# Patient Record
Sex: Female | Born: 1940 | Race: White | Hispanic: No | Marital: Single | Smoking: Current every day smoker
Health system: Southern US, Community
[De-identification: ages and names within clinical notes are randomized; demographics above are authoritative.]

## PROBLEM LIST (undated history)

## (undated) DIAGNOSIS — J449 Chronic obstructive pulmonary disease, unspecified: Secondary | ICD-10-CM

## (undated) DIAGNOSIS — J45909 Unspecified asthma, uncomplicated: Secondary | ICD-10-CM

## (undated) DIAGNOSIS — C801 Malignant (primary) neoplasm, unspecified: Secondary | ICD-10-CM

## (undated) DIAGNOSIS — I10 Essential (primary) hypertension: Secondary | ICD-10-CM

## (undated) HISTORY — PX: CORONARY ARTERY BYPASS GRAFT: SHX141

## (undated) HISTORY — PX: LUNG REMOVAL, PARTIAL: SHX233

---

## 2006-11-29 ENCOUNTER — Ambulatory Visit (HOSPITAL_COMMUNITY): Admission: RE | Admit: 2006-11-29 | Discharge: 2006-11-29 | Payer: Self-pay | Admitting: Specialist

## 2006-12-09 ENCOUNTER — Ambulatory Visit: Payer: Self-pay | Admitting: Thoracic Surgery

## 2006-12-17 ENCOUNTER — Encounter: Payer: Self-pay | Admitting: Thoracic Surgery

## 2006-12-17 ENCOUNTER — Ambulatory Visit: Payer: Self-pay | Admitting: Thoracic Surgery

## 2006-12-17 ENCOUNTER — Ambulatory Visit: Payer: Self-pay | Admitting: Pulmonary Disease

## 2006-12-17 ENCOUNTER — Inpatient Hospital Stay (HOSPITAL_COMMUNITY): Admission: RE | Admit: 2006-12-17 | Discharge: 2006-12-24 | Payer: Self-pay | Admitting: Thoracic Surgery

## 2006-12-20 ENCOUNTER — Encounter: Payer: Self-pay | Admitting: Thoracic Surgery

## 2006-12-30 ENCOUNTER — Ambulatory Visit: Payer: Self-pay | Admitting: Thoracic Surgery

## 2006-12-30 ENCOUNTER — Encounter: Admission: RE | Admit: 2006-12-30 | Discharge: 2006-12-30 | Payer: Self-pay | Admitting: Thoracic Surgery

## 2007-01-27 ENCOUNTER — Encounter: Admission: RE | Admit: 2007-01-27 | Discharge: 2007-01-27 | Payer: Self-pay | Admitting: Thoracic Surgery

## 2007-01-27 ENCOUNTER — Ambulatory Visit: Payer: Self-pay | Admitting: Thoracic Surgery

## 2007-03-10 ENCOUNTER — Encounter: Admission: RE | Admit: 2007-03-10 | Discharge: 2007-03-10 | Payer: Self-pay | Admitting: Thoracic Surgery

## 2007-03-10 ENCOUNTER — Ambulatory Visit: Payer: Self-pay | Admitting: Thoracic Surgery

## 2007-05-10 ENCOUNTER — Ambulatory Visit: Payer: Self-pay | Admitting: Thoracic Surgery

## 2007-05-10 ENCOUNTER — Encounter: Admission: RE | Admit: 2007-05-10 | Discharge: 2007-05-10 | Payer: Self-pay | Admitting: Thoracic Surgery

## 2008-03-06 IMAGING — CR DG CHEST 2V
2 series · 2 of 2 positions shown · non-contrast
Comparison: Chest two views 12/24/06.

CLINICAL DATA: Lung lesion and postop. 
CHEST ? 2 VIEW:

[view not recorded (1 of 2)]
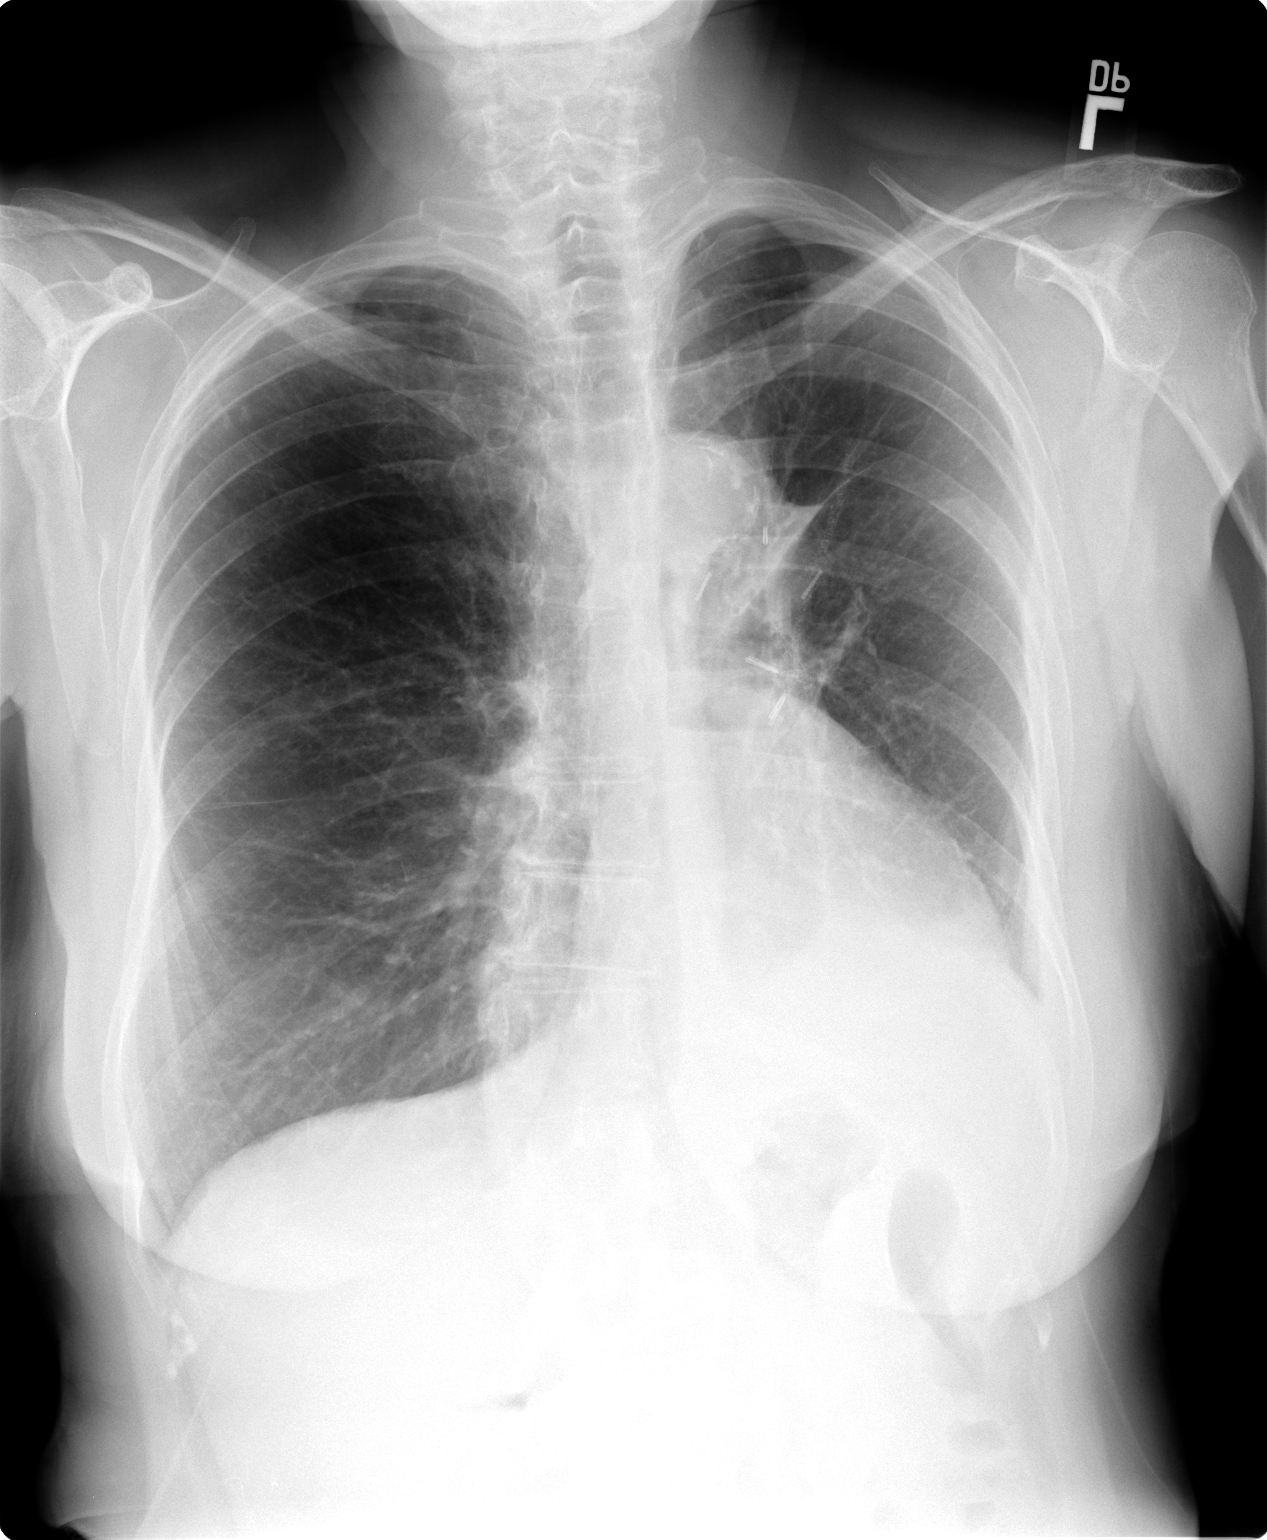

[view not recorded (2 of 2)]
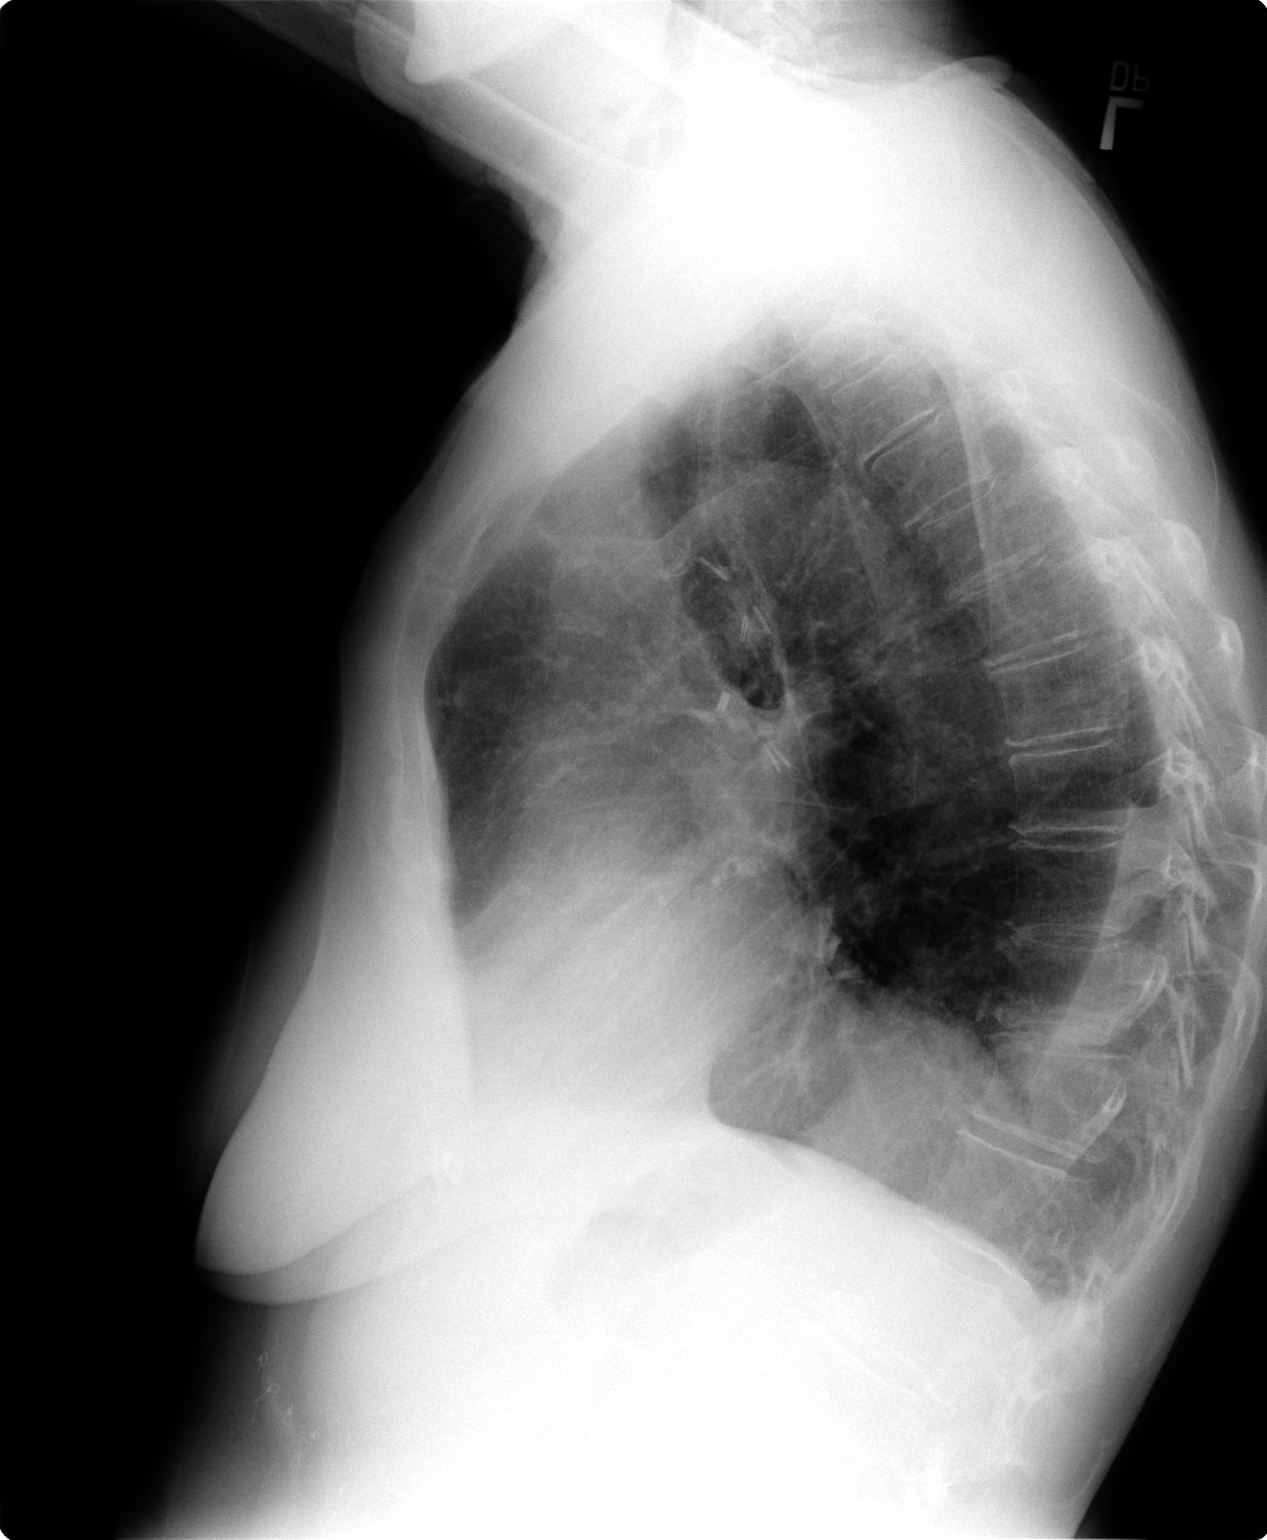

[2 of 2 positions shown; findings below may reference images not displayed]

FINDINGS: Stable enlarged cardiac silhouette.  There is left lower lobe atelectasis and a small effusion.  The hydropneumothorax is not evident.  Postsurgical changes in the left upper lobe.  The right lung appears clear.  Thin horizontal linear line extending from the fluid collection adjacent to the aortic arch is felt to be postsurgical interface and not a pneumothorax.
IMPRESSION: 1.  Left hydropneumothorax is not clearly demonstrated. 
2.  Left lower lobe atelectasis and small effusion.  
3.  Horizontal line extending from the aortic arch is felt to be postsurgical interface and not pneumothorax.

## 2010-02-09 ENCOUNTER — Encounter: Payer: Self-pay | Admitting: Specialist

## 2010-06-03 NOTE — Assessment & Plan Note (Signed)
OFFICE VISIT   Tamara Key, Tamara Key  DOB:  11/04/1940                                        March 10, 2007  CHART #:  [id number]   [name] came today for followup.  She is now two months since her  surgery.  She has undergone three courses of chemotherapy.  Chest x-ray  showed stable postoperative change on the right with no acute or  superimposed abnormality.  Lungs are clear to auscultation and  percussion..  Overall she is doing remarkably well.  I plan to see her  back again in two months with a chest x-ray.   Her blood pressure is 118/60, pulse 100, respirations 18, saturations  were 97%.   Ines Bloomer, M.D.  Electronically Signed   DPB/MEDQ  D:  03/10/2007  T:  03/11/2007  Job:  [id number]

## 2010-06-03 NOTE — Assessment & Plan Note (Signed)
OFFICE VISIT   Tamara Key, Tamara Key  DOB:  May 03, 1940                                        May 11, 2007  CHART #:  [id number]   Her blood pressure is 126/67, pulse 99, respirations 80 and saturations  were 97%.  She has completed her chemotherapy and her hair is growing  back.  Her chest x-ray is stable with no acute evidence of any  recurrence.  Lungs are clear to auscultation and percussion.  She is  being followed by Dr. Gilman Buttner, and I plan to see her back again in four  months with a chest x-ray.   Ines Bloomer, M.D.  Electronically Signed   DPB/MEDQ  D:  05/11/2007  T:  05/11/2007  Job:  [id number]

## 2010-06-03 NOTE — Assessment & Plan Note (Signed)
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  This procedure was done  12/17/2006.  Currently, she describes some mild discomfort.  She is  having some shortness of breath on occasion, but it is not limiting.  She has returned to driving, although that does have some discomfort  associated with it.  She is scheduled to see Dr. Gilman Buttner is [place] for  oncology followup this month.   PHYSICAL EXAMINATION:  Vital signs:  Blood pressure 118/66, pulse 106,  respirations 18, oxygen saturation is 98% on room air.  General:  An  elderly female in no acute distress.  Cardiac examination:  Regular rate  and rhythm, normal S1, S2.  Pulmonary examination:  Clear breath sounds  throughout.  Incision well healed without evidence of infection.   Chest x-ray report none on exam done today, reveals resolved prior  pneumothorax.  There is improved aeration of the left lower lobe with  persistent postop pleural parenchymal left lung opacity.  There are also  stable chronic bronchitis changes in the right lung.  There is otherwise  no new abnormalities.   ASSESSMENT:  The patient continues to do well in her recovery following  her lobectomy.  We will see her again in 6 weeks with a chest x-ray.  She will follow up with Dr. Gilman Buttner for oncology.   Rowe Clack, P.A.-C.   Sherryll Burger  D:  01/27/2007  T:  01/27/2007  Job:  [id number]   cc:   Dellia Beckwith, M.D.

## 2010-06-03 NOTE — Discharge Summary (Signed)
Tamara, Key                ACCOUNT NO.:  [id number]   MEDICAL RECORD NO.:  [id number]          PATIENT TYPE:  INP   LOCATION:  2012                         FACILITY:  [facility]   PHYSICIAN:  Ines Bloomer, M.D. DATE OF BIRTH:  1940/08/15   DATE OF ADMISSION:  12/17/2006  DATE OF DISCHARGE:  12/23/2006                               DISCHARGE SUMMARY   FINAL DIAGNOSIS:  Nonsmall cell lung cancer, left upper lobe stage  [id number].   IN-HOSPITAL DIAGNOSES:  1. Postoperative confusion.  2. Hyponatremia.  3. Acute hypoplastic anemia postoperatively.   SECONDARY DIAGNOSES:  1. Hypertension.  2. Seasonal allergies.  3. Long history of tobacco use.   IN-HOSPITAL OPERATIONS AND PROCEDURES:  Left video-assisted thoracic  surgery with mini-thoracotomy, left upper lobectomy, with lymph node  dissection.   HISTORY AND PHYSICAL AND HOSPITAL COURSE:  The patient is a 70 year old  female who has a long history of tobacco use and continues to smoke  about 1/2 pack per day.  She was found to have a left upper lobe lesion.  Chest x-ray and CT were done and verified this lesion.  PET scan was  ordered and showed positive with an SUV uptake in this area of 9.5.  There is questionable left hilar node with an uptake of 4.5.  Pulmonary  function tests done showed an FEV of 2.82 with an FEV-1 of 101.97.  Diffusion capacity, however, was diminished at 50%.  She had no  hemoptysis, fevers, chills, excessive sputum, or weight loss.   The patient was seen and evaluated by Dr. Edwyna Shell.  Dr. Edwyna Shell discussed  with the patient undergoing left VATS with thoracotomy with left upper  lobectomy.  He discussed the risks and benefit with the patient.  The  patient nodded to her understanding and agreed to proceed.  Surgery was  scheduled for December 09, 2006.  For details of the patient's past  medical history and physical exam, please see dictated H&P.   The patient was taken to the operating room  December 09, 2006, where she  underwent left video-assisted thoracic surgery with mini-thoracotomy,  left upper lobectomy and lymph node dissection.  The patient tolerated  this procedure well and she returned to the intensive care unit in  stable condition.   Postoperatively, the patient was able to be extubated.  She was noted to  be hemodynamically stable, at that time.  Postoperatively, the patient  did have mild confusion.  She did pull her A-line out the evening of  postoperative day 1.  The patient was started on Haldol and Ativan at  that time.  Over the next several days, the patient's postoperative  confusion improved and she was back towards baseline by postoperative  day 4.   Followup chest x-rays were obtained postoperatively.  They are noted to  be stable.  The patient noted to have early postoperative day 1 suction  was decreased.  By postop day 2, one chest tube was able to be  discontinued and the patient was placed to water seal.  The following  day, remaining chest tube with questionable  air leak, but this was noted  to improve by postoperative day 4 and remaining chest tube was  discontinued.  Followup chest x-ray was stable with no pneumothorax.  Repeat chest x-rays prior to discharge home remained stable.   The patient was initially transferred to 2300 postoperatively.  After  confusion improved, she was able to be transferred down to 33 by  postoperative day 3.  Vital signs continuously monitored during this  time.  She remained afebrile.  Her morning blood pressures were stable  on labetalol and caduet.  The patient was eventually able to be weaned  off oxygen saturating greater than 90% on room air.  She did develop a  leukocytosis postoperatively with a white count of 12.7.  The patient  was started on Fortaz prophylactically.  As stated, the patient was  afebrile and white count was monitored.  It was back towards normal  limits over the next several days  and Elita Quick was discontinued.   The patient also noted to develop hyponatremia during her postoperative  course.  Sodium level dropped to 120 and she was started on normal  saline fluids.  This was monitored, by postop day 4, was back to normal  limits.   Postoperatively, the patient was out of bed, ambulating with mild  assistance and progressing well.  She was tolerating diet well.  No  nausea, vomiting noted.  The patient remained in normal sinus rhythm.  Pulmonary status continued to improve.  All incisions were clean, dry  and intact and healing well.  She did have slight acute hypoplastic  anemia, but was stable.  Her pathology report came back positive,  showing stage 2b nonsmall cell lung cancer, [id number].   By December 23, 2006, the patient was note to be stable and transferred  down to 2000.  She is to continue to be monitored.   If the patient remains stable overnight, she will be ready for discharge  home December 24, 2006.  Will obtain PA and lateral chest x-ray prior to  discharge home.   A followup appointment has been arranged with Dr. Edwyna Shell for December 30, 2006 at 4:30 p.m.  The patient will need to obtain a PA and lateral  chest x-ray 30 minutes prior to this appointment.   ACTIVITY:  The patient instructed no driving for 2 weeks, no lifting  over 10 pounds.  She was told to ambulate 3 to 4 times a day, progress  as tolerated and continue her breathing exercises.   INCISIONAL CARE:  The patient was told to shower, washing her incisions,  using soap and water.  She is contact the office if she develops any  drainage or openings from any of her incision sites.   DIET:  The patient given diet to be low-fat, low-salt.   DISCHARGE MEDICATIONS:  1. Caduet 5/20 mg daily.  2. Singulair 10 mg daily.  3. Combivent inhaler 2 puffs q.6 hours.  4. Flovent 220 mcg 1 puff b.i.d.  5. Labetalol 100 mg b.i.d.  6. Percocet 5/325 one to two tabs q.4 to 6 hours p.r.n.  pain.      Theda Belfast, [place]      Ines Bloomer, M.D.  Electronically Signed    KMD/MEDQ  D:  12/23/2006  T:  12/23/2006  Job:  [id number]   cc:   Ines Bloomer, M.D.

## 2010-06-03 NOTE — H&P (Signed)
Tamara Key, Tamara Key                ACCOUNT NO.:  [id number]   MEDICAL RECORD NO.:  [id number]          PATIENT TYPE:  INP   LOCATION:  NA                           FACILITY:  [facility]   PHYSICIAN:  Ines Bloomer, M.D. DATE OF BIRTH:  [birth year]   DATE OF ADMISSION:  DATE OF DISCHARGE:                              HISTORY & PHYSICAL   CHIEF COMPLAINT:  Left lung mass.   HISTORY OF PRESENT ILLNESS:  This particular patient has a long history  of smoking and continues to smoke 1/2 pack of cigarettes a day, although  she says she has cut down.  She was found to have a left upper lobe  lesion. Chest x-ray and CT scan showed this lesion.  PET scan was  positive of an SUV with uptake in this area of 9.5.  There was a  questionable left hilar node with an uptake of 4.5.  Her pulmonary  function test showed FEC of 2.82 with an FEV1 of 101.97.  Effusion  capacity however was diminished at 50%.  She had no hemoptysis, fever,  chills, excessive sputum, or weight loss.  The left upper lobe mass  seemed to be deep in the lung parenchyma.   PAST MEDICAL HISTORY:  SHE IS ALLERGIC TO CODEINE.   MEDICATIONS:  1. Caduet 10 mg a day.  2. Celebrex 200 mg a day.  3. Singulair 10 mg a day.  4. Astelin and Symbicort p.r.n.   FAMILY HISTORY:  Positive for diabetes and asthma.  Negative for  vascular disease.   SOCIAL HISTORY:  She smokes 1/2 to 3/4 pack of cigarettes a day.  She is  single, has 3 children.  She is retired.  Does not drink alcohol on a  regular basis.   REVIEW OF SYSTEMS:  She is 138 pounds.  She is 5 foot 8 inches.  CARDIAC:  No angina or atrial fibrillation.  PULMONARY:  Been treated  for asthma.  GI:  No nausea, vomiting, constipation, diarrhea, GERD.  GU:  No dysuria or frequent urination.  VASCULAR:  No claudication, DVT,  TIA.  NEUROLOGICAL:  No headaches, blackouts, or seizures.  MUSCULOSKELETAL:  She has arthritis.  PSYCHIATRIC:  No depression or  nervousness.  HEENT:  No  change in her eyesight or hearing.  HEMATOLOGIC:  No problems with bleeding or clotting disorder.   PHYSICAL EXAMINATION:  VITAL SIGNS:  Her blood pressure is 120/64, pulse  100, respirations 18, sats were 99%.  HEAD:  Atraumatic.  EYES:  Pupils equally round and reactive to light and accommodation.  Extraocular movements normal.  EARS:  Tympanic membranes intact.  NOSE:  There is no septal deviation.  NECK:  Supple without thyromegaly.  CHEST:  Clear to auscultation and percussion, but there are some mild  wheezes.  HEART:  Regular sinus rhythm.  No murmurs.  ABDOMEN:  Soft.  There is no hepatosplenomegaly.  Bowel sounds are  normal.  EXTREMITIES:  Pulses are 2+.  There is no clubbing or edema.  NEUROLOGICAL:  She is oriented x3.  Sensory and motor intact.  Cranial  nerves  are intact.   I feel that she probably has stage 2A non-small cell cancer.  Possibly  this could just be stage 1A.  I would like to do surgery first followed  by adjuvant chemotherapy.  I have discussed the option with Miss Cage  and she agrees with the surgery.   IMPRESSION:  1. Left upper lobe mass.  2. Tobacco abuse.  3. Chronic obstructive pulmonary disease.  4. Hypertension.  5. Osteoarthritis.      Ines Bloomer, M.D.  Electronically Signed     DPB/MEDQ  D:  12/14/2006  T:  12/15/2006  Job:  [id number]

## 2010-06-03 NOTE — Letter (Signed)
December 09, 2006   Tanvir A. Blenda Nicely, M.D.  [address]  [place], [place]. [address]   Re:  Tamara, Key                DOB:  Sep 29, 1940   Dear Dr. Blenda Nicely,   I appreciate the opportunity to see Ms. Dotter.  This 70 year old  patient has a long history of smoking and continues to smoke a half pack  of cigarettes a day, although she says she has cut down.  She is found  to have a left upper lobe lesion.  Chest x-ray and CT scan showed a left  upper lobe lesion.  PET scan was positive in this area with uptake of  9.5.  There was a questionable left hilar node that had an uptake of  4.5.  Her pulmonary function tests showed FVC of 2.82 and FEV1 of 1.97.  However, her diffusion capacity was decreased at 44%.  She had normal  hemoptysis, fever, chills, excessive sputum, or weight loss.  The left  upper lobe mass is deep in the left lung.   PAST MEDICAL HISTORY:  She is allergic to CODEINE.   MEDICATIONS:  1. Caduet 10 mg daily.  2. Celebrex 200 mg.  3. Singulair 10 mg daily.  4. Astelin p.r.n.  5. Symbicort p.r.n.   FAMILY HISTORY:  Positive for diabetes and asthma.  Negative for  vascular disease.   SOCIAL HISTORY:  She smokes a half to 3/4 pack of cigarettes a day.  She  is single, has three children.  She is retired.  Does not drink alcohol  on a regular basis.   REVIEW OF SYSTEMS:  She is 138 pounds.  She is 5 feet 3.  CARDIAC:  No angina or atrial fibrillation.  PULMONARY:  Must have treated for asthma.  No hemoptysis, home oxygen,  fever or chills.  GI:  No nausea, vomiting, constipation, or diarrhea.  GU:  No dysuria, frequency of urination.  VASCULAR:  No claudication, DVT, or TIAs.  NEUROLOGIC:  No headaches, blackouts, paresthesias.  MUSCULOSKELETAL:  She had arthritis.  No psychiatric illness.  EENT:  No change in her eyesight or hearing.  HEMATOLOGIC:  No positive bleeding or clotting disorders.   PHYSICAL EXAMINATION:  Her blood pressure is 120/64.  Pulse  100,  respirations 18, sats 95%.  HEENT:  Unremarkable.  Neck is supple  without thyromegaly.  Chest shows some bilateral wheezes.  Heart:  Regular sinus rhythm.  No murmurs.  Abdomen is soft.  There is no  hepatosplenomegaly.  Bowel sounds are normal.  Extremities:  Pulses are  2+.  There is no clubbing or edema.  Neurologic:  She is oriented x3.  Sensory and motor are intact.  Cranial nerves are intact.   I feel that she has probably a 2A non-small-cell lung cancer.  This  could either be treated with preoperative radiation or preoperative  chemotherapy or just proceed with surgery.  I feel that probably surgery  is the best option in this case.  We will go ahead and schedule for  surgery in the near future.   Appreciate the opportunity to see Ms. Adelsberger.   Ines Bloomer, M.D.  Electronically Signed   DPB/MEDQ  D:  12/09/2006  T:  12/10/2006  Job:  [id number]

## 2010-06-03 NOTE — Letter (Signed)
December 30, 2006   Dellia Beckwith, M.D.  [address]  [place], [place] [address]   Re:  Tamara Key, Tamara Key                DOB:  [birth year]   Dear Neysa Bonito,   This is to refer you to [name].  She is a patient I  received from Dr. Blenda Nicely and she underwent a left upper lobectomy and  was found to have non-small-cell lung cancer with one node being  positive, so she is a Stage II-A.   Her blood pressure is 134/67, pulse 93, respirations 18, sats were 93%.   I have recommended she see you for adjunctive chemotherapy.   Her incision was well-healed today.  Her lungs were clear to  auscultation and percussion.   I will see her back again in three weeks with a chest x-ray.   Ines Bloomer, M.D.  Electronically Signed   DPB/MEDQ  D:  12/30/2006  T:  12/31/2006  Job:  [id number]   cc:   Eual Fines A. Chodri, M.D.

## 2010-06-03 NOTE — Op Note (Signed)
Tamara Key, COZART                ACCOUNT NO.:  [id number]   MEDICAL RECORD NO.:  [id number]          PATIENT TYPE:  INP   LOCATION:  2550                         FACILITY:  [facility]   PHYSICIAN:  Ines Bloomer, M.D. DATE OF BIRTH:  December 20, 1940   DATE OF PROCEDURE:  12/17/2006  DATE OF DISCHARGE:                               OPERATIVE REPORT   PREOPERATIVE DIAGNOSIS:  Non-small cell left upper lobe mass.   POSTOPERATIVE DIAGNOSIS:  Stage IIA non-small cell lung cancer, left  upper lobe.   OPERATION PERFORMED:  Left video-assisted thoracoscopy (VATS), mini-  thoracotomy, left upper lobectomy with node dissection.   SURGEON:  Dr. Patricia Nettle. Burney   FIRST ASSISTANT:  Mrs. Luster Landsberg, RNFA   ANESTHESIA:  General anesthesia.   DESCRIPTION OF PROCEDURE:  After percutaneous insertion of all  monitoring lines, the patient underwent general anesthesia and was  turned to the left lateral thoracotomy position.  Dual-lumen tube  inserted.  A trocar site was made in the sixth intercostal space at the  anterior axillary line.  A 0-degree scope was inserted and the lung was  examined for any metastases.  The lesion could not be seen on the scope  but we used it to make a small posterolateral incision over the fifth  intercostal space, partially dividing the latissimus, reflecting the  serratus anteriorly, putting a small Tuffier in the fifth intercostal  space.  The lesion was easily palpable in the posterior segment of the  left upper lobe.  Dissection started in AP window, dissecting out five  nodes and dissecting out the main pulmonary artery.  Then the fissure  was identified and the superior portion of the fissure was divided with  the auto-suture 3.5 stapler and this exposed the two anterior branches  and a lingular branch.  Several 4L and 10L nodes were dissected free  from around the mainstem bronchus and right upper lobe bronchus.  Then  we divided the inferior portion of the fissure  with the auto-suture  stapler using the 4.5 stapler.  The superior pulmonary vein was  dissected out and divided with the auto-suture stapler 2.0 mm stapler  and this exposed the lingular artery.  We also took down the inferior  pulmonary ligament with electrocautery.  The lingular artery had a large  node stuck to it posteriorly.  We had to carefully dissect this out.  It  appeared that this node would possibly have metastatic disease, so we  sent it for frozen section, continued our dissection, divided the  lingular artery between ties and clips and then dissected the pulmonary  artery off the bronchus, dissected this and then stapled the bronchus  with a TA-30 stapler and divided it.  There was a large apical posterior  branch coming off of the pulmonary artery and we divided that with the  auto-suture 30 mm 2 gray stapler.  The left upper lobe was removed.  The  node that we sent did come back positive for non-small cell lung cancer.  Bronchial margins were done and they were negative.  The patient was at  least  a IIA if not a IIB non-small cell lung cancer.   The chest tube was brought into the trocar site and tied in place with 0  silk at a stab wound in the midaxillary line at the eighth intercostal  space.  A single On-Q was inserted in the usual fashion.  A Marcaine  block was done in the usual fashion.  The chest tube was placed through  the other incision and tied in place with 0 silk.  Four holes were  drilled through the sixth rib and then the pericostals were passed  around the sixth rib to the anterior rib.  CoSeal was  applied to the staple line.  No air leak was seen from the bronchus.  The chest was closed.  The lung was re-expanded and the pericostals tied  down.  The chest was closed with #1 Vicryl in the muscle layer, 2-0  Vicryl in the subcutaneous tissue, and Dermabond for the skin.  The  patient returned to the recovery room in stable condition.      Ines Bloomer, M.D.  Electronically Signed     DPB/MEDQ  D:  12/17/2006  T:  12/17/2006  Job:  [id number]

## 2010-10-27 LAB — CBC
MCHC: 34.3
MCHC: 34.6
MCV: 94.4
MCV: 94.9
Platelets: 203
RBC: 3.32 — ABNORMAL LOW
RDW: 12.7
RDW: 13.1

## 2010-10-27 LAB — BASIC METABOLIC PANEL
BUN: 11
BUN: 9
CO2: 23
CO2: 29
Calcium: 8.1 — ABNORMAL LOW
Chloride: 91 — ABNORMAL LOW
Chloride: 98
Creatinine, Ser: 0.6
Creatinine, Ser: 0.63
GFR calc Af Amer: >60
Glucose, Bld: 94

## 2010-10-27 LAB — CORTISOL: Cortisol, Plasma: 20.8

## 2010-10-27 LAB — OSMOLALITY: Osmolality: 257 — ABNORMAL LOW

## 2010-10-28 LAB — COMPREHENSIVE METABOLIC PANEL
ALT: 17
AST: 27
Albumin: 3.1 — ABNORMAL LOW
Albumin: 4.2
BUN: 15
Calcium: 8.2 — ABNORMAL LOW
Creatinine, Ser: 0.83
GFR calc Af Amer: >60
Glucose, Bld: 124 — ABNORMAL HIGH
Potassium: 3.9
Sodium: 120 — ABNORMAL LOW
Total Protein: 6.2
Total Protein: 7.3

## 2010-10-28 LAB — CBC
HCT: 31.6 — ABNORMAL LOW
HCT: 40
MCHC: 34.6
MCV: 94.3
MCV: 95.2
Platelets: 219
Platelets: 237
Platelets: 302
RDW: 12.2
RDW: 13
RDW: 13.1

## 2010-10-28 LAB — BASIC METABOLIC PANEL
CO2: 25
Calcium: 8.4
Chloride: 90 — ABNORMAL LOW
GFR calc Af Amer: >60
GFR calc Af Amer: >60
GFR calc Af Amer: >60
GFR calc non Af Amer: >60
GFR calc non Af Amer: >60
Glucose, Bld: 112 — ABNORMAL HIGH
Potassium: 4.6
Potassium: 4.9
Sodium: 123 — ABNORMAL LOW
Sodium: 124 — ABNORMAL LOW
Sodium: 132 — ABNORMAL LOW

## 2010-10-28 LAB — CARDIAC PANEL(CRET KIN+CKTOT+MB+TROPI)
CK, MB: 6.5 — ABNORMAL HIGH
CK, MB: 6.5 — ABNORMAL HIGH
Relative Index: 1.6
Total CK: 372 — ABNORMAL HIGH
Troponin I: 0.02
Troponin I: 0.03

## 2010-10-28 LAB — URINE MICROSCOPIC-ADD ON

## 2010-10-28 LAB — URINALYSIS, ROUTINE W REFLEX MICROSCOPIC
Nitrite: NEGATIVE
Specific Gravity, Urine: 1.009
Urobilinogen, UA: 0.2

## 2010-10-28 LAB — POCT I-STAT 3, ART BLOOD GAS (G3+)
TCO2: 24
pCO2 arterial: 36.9
pH, Arterial: 7.399

## 2010-10-28 LAB — TYPE AND SCREEN

## 2010-10-28 LAB — BLOOD GAS, ARTERIAL
Bicarbonate: 22.6
FIO2: 0.21
O2 Saturation: 96.9
Patient temperature: 98.6

## 2010-10-28 LAB — NA AND K (SODIUM & POTASSIUM), RAND UR
Potassium Urine: 82
Sodium, Ur: 76

## 2010-10-28 LAB — PROTIME-INR: INR: 0.9

## 2010-10-28 LAB — APTT: aPTT: 29

## 2014-10-31 ENCOUNTER — Encounter (HOSPITAL_COMMUNITY): Payer: Self-pay | Admitting: Emergency Medicine

## 2014-10-31 ENCOUNTER — Emergency Department (HOSPITAL_COMMUNITY): Payer: Medicare HMO

## 2014-10-31 ENCOUNTER — Inpatient Hospital Stay (HOSPITAL_COMMUNITY)
Admission: EM | Admit: 2014-10-31 | Discharge: 2014-11-08 | DRG: 193 | Disposition: A | Discharge status: Hospice | Payer: Medicare HMO | Attending: Internal Medicine | Admitting: Internal Medicine

## 2014-10-31 DIAGNOSIS — J9809 Other diseases of bronchus, not elsewhere classified: Secondary | ICD-10-CM

## 2014-10-31 DIAGNOSIS — R627 Adult failure to thrive: Secondary | ICD-10-CM | POA: Diagnosis present

## 2014-10-31 DIAGNOSIS — J189 Pneumonia, unspecified organism: Secondary | ICD-10-CM | POA: Diagnosis present

## 2014-10-31 DIAGNOSIS — R410 Disorientation, unspecified: Secondary | ICD-10-CM | POA: Diagnosis present

## 2014-10-31 DIAGNOSIS — J9601 Acute respiratory failure with hypoxia: Secondary | ICD-10-CM | POA: Diagnosis present

## 2014-10-31 DIAGNOSIS — R0600 Dyspnea, unspecified: Secondary | ICD-10-CM

## 2014-10-31 DIAGNOSIS — R918 Other nonspecific abnormal finding of lung field: Secondary | ICD-10-CM | POA: Diagnosis not present

## 2014-10-31 DIAGNOSIS — Z902 Acquired absence of lung [part of]: Secondary | ICD-10-CM

## 2014-10-31 DIAGNOSIS — R0902 Hypoxemia: Secondary | ICD-10-CM

## 2014-10-31 DIAGNOSIS — C3432 Malignant neoplasm of lower lobe, left bronchus or lung: Secondary | ICD-10-CM

## 2014-10-31 DIAGNOSIS — E46 Unspecified protein-calorie malnutrition: Secondary | ICD-10-CM | POA: Diagnosis present

## 2014-10-31 DIAGNOSIS — Z7189 Other specified counseling: Secondary | ICD-10-CM | POA: Insufficient documentation

## 2014-10-31 DIAGNOSIS — Z85118 Personal history of other malignant neoplasm of bronchus and lung: Secondary | ICD-10-CM

## 2014-10-31 DIAGNOSIS — J449 Chronic obstructive pulmonary disease, unspecified: Secondary | ICD-10-CM

## 2014-10-31 DIAGNOSIS — E871 Hypo-osmolality and hyponatremia: Secondary | ICD-10-CM

## 2014-10-31 DIAGNOSIS — J42 Unspecified chronic bronchitis: Secondary | ICD-10-CM | POA: Diagnosis not present

## 2014-10-31 DIAGNOSIS — J690 Pneumonitis due to inhalation of food and vomit: Secondary | ICD-10-CM | POA: Diagnosis not present

## 2014-10-31 DIAGNOSIS — Z515 Encounter for palliative care: Secondary | ICD-10-CM | POA: Diagnosis not present

## 2014-10-31 DIAGNOSIS — J9819 Other pulmonary collapse: Secondary | ICD-10-CM | POA: Diagnosis not present

## 2014-10-31 DIAGNOSIS — I10 Essential (primary) hypertension: Secondary | ICD-10-CM | POA: Diagnosis present

## 2014-10-31 DIAGNOSIS — E860 Dehydration: Secondary | ICD-10-CM | POA: Diagnosis present

## 2014-10-31 DIAGNOSIS — Z9221 Personal history of antineoplastic chemotherapy: Secondary | ICD-10-CM | POA: Diagnosis not present

## 2014-10-31 DIAGNOSIS — J9811 Atelectasis: Secondary | ICD-10-CM | POA: Diagnosis present

## 2014-10-31 DIAGNOSIS — N179 Acute kidney failure, unspecified: Secondary | ICD-10-CM | POA: Diagnosis not present

## 2014-10-31 DIAGNOSIS — Z66 Do not resuscitate: Secondary | ICD-10-CM | POA: Diagnosis not present

## 2014-10-31 DIAGNOSIS — F17200 Nicotine dependence, unspecified, uncomplicated: Secondary | ICD-10-CM | POA: Diagnosis present

## 2014-10-31 DIAGNOSIS — F0390 Unspecified dementia without behavioral disturbance: Secondary | ICD-10-CM | POA: Diagnosis present

## 2014-10-31 DIAGNOSIS — T17500A Unspecified foreign body in bronchus causing asphyxiation, initial encounter: Secondary | ICD-10-CM

## 2014-10-31 DIAGNOSIS — J441 Chronic obstructive pulmonary disease with (acute) exacerbation: Secondary | ICD-10-CM | POA: Diagnosis not present

## 2014-10-31 DIAGNOSIS — C3492 Malignant neoplasm of unspecified part of left bronchus or lung: Secondary | ICD-10-CM | POA: Diagnosis not present

## 2014-10-31 DIAGNOSIS — J438 Other emphysema: Secondary | ICD-10-CM | POA: Diagnosis not present

## 2014-10-31 DIAGNOSIS — J45909 Unspecified asthma, uncomplicated: Secondary | ICD-10-CM | POA: Diagnosis present

## 2014-10-31 DIAGNOSIS — D638 Anemia in other chronic diseases classified elsewhere: Secondary | ICD-10-CM | POA: Diagnosis present

## 2014-10-31 DIAGNOSIS — R1310 Dysphagia, unspecified: Secondary | ICD-10-CM | POA: Diagnosis present

## 2014-10-31 DIAGNOSIS — R0781 Pleurodynia: Secondary | ICD-10-CM | POA: Diagnosis present

## 2014-10-31 HISTORY — DX: Unspecified asthma, uncomplicated: J45.909

## 2014-10-31 HISTORY — DX: Malignant (primary) neoplasm, unspecified: C80.1

## 2014-10-31 HISTORY — DX: Essential (primary) hypertension: I10

## 2014-10-31 HISTORY — DX: Chronic obstructive pulmonary disease, unspecified: J44.9

## 2014-10-31 LAB — COMPREHENSIVE METABOLIC PANEL
ALK PHOS: 96 U/L (ref 38–126)
ALT: 14 U/L (ref 14–54)
AST: 27 U/L (ref 15–41)
Albumin: 3.7 g/dL (ref 3.5–5.0)
Anion gap: 16 — ABNORMAL HIGH (ref 5–15)
BUN: 16 mg/dL (ref 6–20)
CALCIUM: 10.2 mg/dL (ref 8.9–10.3)
CHLORIDE: 92 mmol/L — AB (ref 101–111)
CO2: 22 mmol/L (ref 22–32)
CREATININE: 0.8 mg/dL (ref 0.44–1.00)
Glucose, Bld: 147 mg/dL — ABNORMAL HIGH (ref 65–99)
Potassium: 4 mmol/L (ref 3.5–5.1)
SODIUM: 130 mmol/L — AB (ref 135–145)
Total Bilirubin: 0.6 mg/dL (ref 0.3–1.2)
Total Protein: 8.1 g/dL (ref 6.5–8.1)

## 2014-10-31 LAB — URINALYSIS, ROUTINE W REFLEX MICROSCOPIC
BILIRUBIN URINE: NEGATIVE
GLUCOSE, UA: NEGATIVE mg/dL
HGB URINE DIPSTICK: NEGATIVE
KETONES UR: 15 mg/dL — AB
LEUKOCYTES UA: NEGATIVE
Nitrite: NEGATIVE
PH: 7 (ref 5.0–8.0)
PROTEIN: NEGATIVE mg/dL
Specific Gravity, Urine: 1.01 (ref 1.005–1.030)
Urobilinogen, UA: 0.2 mg/dL (ref 0.0–1.0)

## 2014-10-31 LAB — I-STAT TROPONIN, ED
TROPONIN I, POC: 0.02 ng/mL (ref 0.00–0.08)
Troponin i, poc: 0.01 ng/mL (ref 0.00–0.08)

## 2014-10-31 LAB — CBC WITH DIFFERENTIAL/PLATELET
BASOS ABS: 0 10*3/uL (ref 0.0–0.1)
Basophils Relative: 0 %
Eosinophils Absolute: 0 10*3/uL (ref 0.0–0.7)
Eosinophils Relative: 0 %
HCT: 37.2 % (ref 36.0–46.0)
HEMOGLOBIN: 12.6 g/dL (ref 12.0–15.0)
LYMPHS ABS: 1.5 10*3/uL (ref 0.7–4.0)
LYMPHS PCT: 14 %
MCH: 30 pg (ref 26.0–34.0)
MCHC: 33.9 g/dL (ref 30.0–36.0)
MCV: 88.6 fL (ref 78.0–100.0)
Monocytes Absolute: 1 10*3/uL (ref 0.1–1.0)
Monocytes Relative: 9 %
NEUTROS PCT: 77 %
Neutro Abs: 8.5 10*3/uL — ABNORMAL HIGH (ref 1.7–7.7)
PLATELETS: 388 10*3/uL (ref 150–400)
RBC: 4.2 MIL/uL (ref 3.87–5.11)
RDW: 13.3 % (ref 11.5–15.5)
WBC: 11.1 10*3/uL — AB (ref 4.0–10.5)

## 2014-10-31 LAB — I-STAT CG4 LACTIC ACID, ED
Lactic Acid, Venous: 1.66 mmol/L (ref 0.5–2.0)
Lactic Acid, Venous: 1.13 mmol/L (ref 0.5–2.0)

## 2014-10-31 LAB — BRAIN NATRIURETIC PEPTIDE: B NATRIURETIC PEPTIDE 5: 106.3 pg/mL — AB (ref 0.0–100.0)

## 2014-10-31 LAB — I-STAT CREATININE, ED: Creatinine, Ser: 0.7 mg/dL (ref 0.44–1.00)

## 2014-10-31 MED ORDER — ZOLPIDEM TARTRATE 5 MG PO TABS
5.0000 mg | ORAL_TABLET | Freq: Every evening | ORAL | Status: DC | PRN
  Administered 2014-11-01: 5 mg via ORAL
  Filled 2014-10-31: qty 1

## 2014-10-31 MED ORDER — ENOXAPARIN SODIUM 30 MG/0.3ML ~~LOC~~ SOLN
30.0000 mg | SUBCUTANEOUS | Status: DC
  Administered 2014-10-31 – 2014-11-07 (×8): 30 mg via SUBCUTANEOUS
  Filled 2014-10-31 (×8): qty 0.3

## 2014-10-31 MED ORDER — GUAIFENESIN ER 600 MG PO TB12
1200.0000 mg | ORAL_TABLET | Freq: Two times a day (BID) | ORAL | Status: DC
  Administered 2014-10-31 – 2014-11-08 (×16): 1200 mg via ORAL
  Filled 2014-10-31 (×17): qty 2

## 2014-10-31 MED ORDER — ONDANSETRON HCL 4 MG/2ML IJ SOLN
4.0000 mg | Freq: Four times a day (QID) | INTRAMUSCULAR | Status: DC | PRN
  Administered 2014-11-01: 4 mg via INTRAVENOUS
  Filled 2014-10-31: qty 2

## 2014-10-31 MED ORDER — IOHEXOL 350 MG/ML SOLN
80.0000 mL | Freq: Once | INTRAVENOUS | Status: AC | PRN
  Administered 2014-10-31: 80 mL via INTRAVENOUS

## 2014-10-31 MED ORDER — DEXTROSE 5 % IV SOLN
500.0000 mg | Freq: Once | INTRAVENOUS | Status: AC
  Administered 2014-10-31: 500 mg via INTRAVENOUS
  Filled 2014-10-31: qty 500

## 2014-10-31 MED ORDER — SODIUM CHLORIDE 0.9 % IV SOLN: 250.0000 mL | INTRAVENOUS | Status: DC | PRN

## 2014-10-31 MED ORDER — SODIUM CHLORIDE 0.9 % IJ SOLN
3.0000 mL | Freq: Two times a day (BID) | INTRAMUSCULAR | Status: DC
  Administered 2014-10-31: 3 mL via INTRAVENOUS
  Administered 2014-11-01: 10 mL via INTRAVENOUS
  Administered 2014-11-02 – 2014-11-06 (×5): 3 mL via INTRAVENOUS

## 2014-10-31 MED ORDER — ONDANSETRON HCL 4 MG/2ML IJ SOLN
4.0000 mg | Freq: Once | INTRAMUSCULAR | Status: AC
  Administered 2014-10-31: 4 mg via INTRAVENOUS
  Filled 2014-10-31: qty 2

## 2014-10-31 MED ORDER — PIPERACILLIN-TAZOBACTAM 3.375 G IVPB
3.3750 g | Freq: Three times a day (TID) | INTRAVENOUS | Status: DC
  Administered 2014-11-01 (×2): 3.375 g via INTRAVENOUS
  Filled 2014-10-31 (×3): qty 50

## 2014-10-31 MED ORDER — ONDANSETRON HCL 4 MG PO TABS: 4.0000 mg | ORAL_TABLET | Freq: Four times a day (QID) | ORAL | Status: DC | PRN

## 2014-10-31 MED ORDER — FENTANYL CITRATE (PF) 100 MCG/2ML IJ SOLN
50.0000 ug | Freq: Once | INTRAMUSCULAR | Status: AC
  Administered 2014-10-31: 50 ug via INTRAVENOUS
  Filled 2014-10-31: qty 2

## 2014-10-31 MED ORDER — LEVOFLOXACIN IN D5W 750 MG/150ML IV SOLN
750.0000 mg | INTRAVENOUS | Status: DC
  Administered 2014-10-31: 750 mg via INTRAVENOUS
  Filled 2014-10-31: qty 150

## 2014-10-31 MED ORDER — ACETAMINOPHEN 325 MG PO TABS
650.0000 mg | ORAL_TABLET | Freq: Four times a day (QID) | ORAL | Status: DC | PRN
Start: 2014-10-31 — End: 2014-11-08

## 2014-10-31 MED ORDER — PIPERACILLIN-TAZOBACTAM 3.375 G IVPB 30 MIN: 3.3750 g | Freq: Once | INTRAVENOUS | Status: DC

## 2014-10-31 MED ORDER — AMLODIPINE BESYLATE 5 MG PO TABS
5.0000 mg | ORAL_TABLET | Freq: Every day | ORAL | Status: DC
  Administered 2014-10-31 – 2014-11-08 (×9): 5 mg via ORAL
  Filled 2014-10-31 (×9): qty 1

## 2014-10-31 MED ORDER — ACETAMINOPHEN 650 MG RE SUPP: 650.0000 mg | Freq: Four times a day (QID) | RECTAL | Status: DC | PRN

## 2014-10-31 MED ORDER — FENTANYL CITRATE (PF) 100 MCG/2ML IJ SOLN
50.0000 ug | Freq: Once | INTRAMUSCULAR | Status: AC
  Administered 2014-10-31: 50 ug via INTRAVENOUS

## 2014-10-31 MED ORDER — ALBUTEROL SULFATE (2.5 MG/3ML) 0.083% IN NEBU
2.5000 mg | INHALATION_SOLUTION | RESPIRATORY_TRACT | Status: DC | PRN
  Administered 2014-11-02 – 2014-11-08 (×3): 2.5 mg via RESPIRATORY_TRACT
  Filled 2014-10-31 (×3): qty 3

## 2014-10-31 MED ORDER — SODIUM CHLORIDE 0.9 % IJ SOLN
3.0000 mL | INTRAMUSCULAR | Status: DC | PRN
  Administered 2014-11-03: 3 mL via INTRAVENOUS
  Filled 2014-10-31: qty 3

## 2014-10-31 MED ORDER — IPRATROPIUM-ALBUTEROL 0.5-2.5 (3) MG/3ML IN SOLN
3.0000 mL | Freq: Four times a day (QID) | RESPIRATORY_TRACT | Status: DC
  Administered 2014-10-31 – 2014-11-01 (×3): 3 mL via RESPIRATORY_TRACT
  Filled 2014-10-31 (×3): qty 3

## 2014-10-31 MED ORDER — SODIUM CHLORIDE 0.9 % IV BOLUS (SEPSIS)
1000.0000 mL | Freq: Once | INTRAVENOUS | Status: AC
Start: 2014-10-31 — End: 2014-10-31
  Administered 2014-10-31: 1000 mL via INTRAVENOUS

## 2014-10-31 MED ORDER — SODIUM CHLORIDE 0.9 % IJ SOLN
3.0000 mL | Freq: Two times a day (BID) | INTRAMUSCULAR | Status: DC
  Administered 2014-11-02 – 2014-11-06 (×4): 3 mL via INTRAVENOUS

## 2014-10-31 MED ORDER — HYDROCODONE-ACETAMINOPHEN 5-325 MG PO TABS
1.0000 | ORAL_TABLET | ORAL | Status: DC | PRN
  Administered 2014-10-31: 2 via ORAL
  Administered 2014-11-01: 1 via ORAL
  Administered 2014-11-01 – 2014-11-06 (×6): 2 via ORAL
  Filled 2014-10-31 (×6): qty 2
  Filled 2014-10-31: qty 1
  Filled 2014-10-31: qty 2

## 2014-10-31 MED ORDER — MORPHINE SULFATE (PF) 2 MG/ML IV SOLN
1.0000 mg | INTRAVENOUS | Status: DC | PRN
  Administered 2014-10-31 – 2014-11-03 (×5): 1 mg via INTRAVENOUS
  Filled 2014-10-31 (×5): qty 1

## 2014-10-31 MED ORDER — METHYLPREDNISOLONE SODIUM SUCC 125 MG IJ SOLR
60.0000 mg | Freq: Four times a day (QID) | INTRAMUSCULAR | Status: DC
  Administered 2014-10-31: 60 mg via INTRAVENOUS
  Administered 2014-11-01: 125 mg via INTRAVENOUS
  Administered 2014-11-01: 60 mg via INTRAVENOUS
  Filled 2014-10-31 (×3): qty 2

## 2014-10-31 MED ORDER — DEXTROSE 5 % IV SOLN
1.0000 g | Freq: Once | INTRAVENOUS | Status: AC
  Administered 2014-10-31: 1 g via INTRAVENOUS
  Filled 2014-10-31: qty 10

## 2014-10-31 NOTE — ED Notes (Signed)
Venturi mask removed. Pt placed on 4L [place]

## 2014-10-31 NOTE — ED Notes (Signed)
Pt informed that urine specimen is needed.

## 2014-10-31 NOTE — H&P (Addendum)
[facility]                                                                                    Patient Demographics  Tamara Key, is a 74 y.o. female  CSN: [id number]  MRN: [id number]  DOB - Feb 13, 1940  Admit Date - 10/31/2014  Outpatient Primary MD for the patient is No primary care provider on file.   With History of -  Past Medical History  Diagnosis Date  . Cancer ([place])     lung cancer  . COPD (chronic obstructive pulmonary disease) ([place])   . Asthma       Past Surgical History  Procedure Laterality Date  . Lung removal, partial      in for   Chief Complaint  Patient presents with  . Shortness of Breath  . Chest Pain     HPI  Tamara Key  is a 74 y.o. female, with past medical history significant for lung cancer, COPD and asthma presenting today with one-week history of shortness of breath and chest pain with no fever chills nausea or vomiting. Patient reports chronic shortness of breath after her lobectomy around 8 years ago that comes and goes. Patient is status post chemotherapy as well 8 years ago . CT of the chest in the emergency room showed a large carinal mass obstructing the left main bronchus with postobstructive pneumonia and lung collapse on the left. Her hypoxemia improved significantly with nasal cannula in the emergency room and I was called to admit.    Review of Systems    In addition to the HPI above, No Fever-chills, No Headache, No changes with Vision or hearing, No problems swallowing food or Liquids, No Chest pain, Cough or Shortness of Breath, No Abdominal pain, No Nausea or Vommitting, Bowel movements are regular, No Blood in stool or Urine, No dysuria, No new skin rashes or bruises, No new joints pains-aches,  No new weakness, tingling, numbness in any extremity, No recent weight gain or loss, No polyuria, polydypsia or polyphagia, No significant Mental Stressors.  A full 10 point Review of Systems was done,  except as stated above, all other Review of Systems were negative.   Social History Social History  Substance Use Topics  . Smoking status: Current Every Day Smoker  . Smokeless tobacco: Not on file  . Alcohol Use: Not on file     Family History No family history on file.   Prior to Admission medications   Medication Sig Start Date End Date Taking? Authorizing Provider  amLODipine (NORVASC) 5 MG tablet Take 5 mg by mouth daily. 07/27/14  Yes Historical Provider, MD  azithromycin (ZITHROMAX) 250 MG tablet as directed. 5 day dosepack 10/30/14  Yes Historical Provider, MD  COMBIVENT RESPIMAT 20-100 MCG/ACT AERS respimat Inhale 1 puff into the lungs 4 (four) times daily. 10/17/14  Yes Historical Provider, MD    No Known Allergies  Physical Exam  Vitals  Blood pressure 144/54, pulse 92, temperature 97.1 F (36.2 C), temperature source Rectal, resp. rate 35, SpO2 95 %.   1. General elderly female, well-developed, slightly malnourished  2. Normal affect and insight, Not Suicidal or Homicidal,  Awake Alert, Oriented X 3.  3. No F.N deficits, grossly   4. Ears and Eyes appear Normal, Conjunctivae clear, PERRLA. Moist Oral Mucosa.  5. Supple Neck, No JVD, No cervical lymphadenopathy appriciated, No Carotid Bruits.  6. Symmetrical Chest wall movement, decreased breath sounds on the left.  7. RRR, No Gallops, Rubs or Murmurs, No Parasternal Heave.  8. Positive Bowel Sounds, Abdomen Soft, Non tender, No organomegaly appriciated,No rebound -guarding or rigidity.  9.  No Cyanosis, Normal Skin Turgor, No Skin Rash or Bruise.  10. Good muscle tone,  joints appear normal , no effusions, Normal ROM.      Data Review  CBC  Recent Labs Lab 10/31/14 1630  WBC 11.1*  HGB 12.6  HCT 37.2  PLT 388  MCV 88.6  MCH 30.0  MCHC 33.9  RDW 13.3  LYMPHSABS 1.5  MONOABS 1.0  EOSABS 0.0  BASOSABS 0.0    ------------------------------------------------------------------------------------------------------------------  Chemistries   Recent Labs Lab 10/31/14 1630 10/31/14 1641  NA 130*  --   K 4.0  --   CL 92*  --   CO2 22  --   GLUCOSE 147*  --   BUN 16  --   CREATININE 0.80 0.70  CALCIUM 10.2  --   AST 27  --   ALT 14  --   ALKPHOS 96  --   BILITOT 0.6  --    ------------------------------------------------------------------------------------------------------------------ CrCl cannot be calculated (Unknown ideal weight.). ------------------------------------------------------------------------------------------------------------------ No results for input(s): TSH, T4TOTAL, T3FREE, THYROIDAB in the last 72 hours.  Invalid input(s): FREET3   Coagulation profile No results for input(s): INR, PROTIME in the last 168 hours. ------------------------------------------------------------------------------------------------------------------- No results for input(s): DDIMER in the last 72 hours. -------------------------------------------------------------------------------------------------------------------  Cardiac Enzymes No results for input(s): CKMB, TROPONINI, MYOGLOBIN in the last 168 hours.  Invalid input(s): CK ------------------------------------------------------------------------------------------------------------------ Invalid input(s): POCBNP   ---------------------------------------------------------------------------------------------------------------  Urinalysis    Component Value Date/Time   COLORURINE YELLOW 10/31/2014 1744   APPEARANCEUR HAZY* 10/31/2014 1744   LABSPEC 1.010 10/31/2014 1744   PHURINE 7.0 10/31/2014 1744   GLUCOSEU NEGATIVE 10/31/2014 1744   HGBUR NEGATIVE 10/31/2014 1744   BILIRUBINUR NEGATIVE 10/31/2014 1744   KETONESUR 15* 10/31/2014 1744   PROTEINUR NEGATIVE 10/31/2014 1744   UROBILINOGEN 0.2 10/31/2014 1744   NITRITE  NEGATIVE 10/31/2014 1744   LEUKOCYTESUR NEGATIVE 10/31/2014 1744    ----------------------------------------------------------------------------------------------------------------   Imaging results:   Ct Angio Chest Pe W/cm &/or Wo Cm  10/31/2014  CLINICAL DATA:  Chest pain and left upper quadrant abdominal pain. Shortness of breath. Low O2 saturation. Nausea. History of lung cancer with left lobectomy. EXAM: CT ANGIOGRAPHY CHEST WITH CONTRAST TECHNIQUE: Multidetector CT imaging of the chest was performed using the standard protocol during bolus administration of intravenous contrast. Multiplanar CT image reconstructions and MIPs were obtained to evaluate the vascular anatomy. CONTRAST:  48m OMNIPAQUE IOHEXOL 350 MG/ML SOLN COMPARISON:  Chest x-ray dated 10/31/2014, 10/30/2014 and 12/29/2012 and chest CT dated 01/04/2012 FINDINGS: There are no pulmonary emboli. There is a 6 x 3 x 3 cm mass just below the carina extending into the left hilum obstructing the bronchus to the remaining portion of the left lung. This mass also compresses the esophagus at the level of the carina. There is almost complete collapse/consolidation of the remaining portion of the left lung. There is either tumor or fluid in the pericardium to the left of midline. Fairly extensive coronary artery disease. Heart size is normal. Severe emphysema of the right upper lobe. Visualized  portion of the upper abdomen demonstrates no acute abnormality. Chronic biliary ductal dilatation. Chronic a parapelvic cyst on the left kidney. Extensive aortic atherosclerosis. Chronic accentuation of the thoracic kyphosis. Review of the MIP images confirms the above findings. 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 Pericardial tumor or fluid adjacent to the mass. Almost complete collapse/ consolidation of the remaining portion of the left lung. Severe emphysema in the right upper lobe. Electronically Signed   By: Lorriane Shire M.D.   On: 10/31/2014 18:20   Dg Chest Portable 1 View  10/31/2014  CLINICAL DATA:  Left upper abdominal pain, chest pain for 1 week. Worsening today. Shortness of breath. Low O2 sats. History of lung cancer EXAM: PORTABLE CHEST 1 VIEW COMPARISON:  10/30/2014 at [place]: Postoperative changes on the left. Worsening aeration on the left with decreasing lung volumes and increasing airspace opacity, presumably atelectasis with the worsening and volume loss. Right lung is clear. Heart is normal size. Underlying COPD. No visible effusions or acute bony abnormality. IMPRESSION: Worsening aeration on the left with increasing airspace disease and worsening volume loss, presumably related to atelectasis. COPD. Electronically Signed   By: Rolm Baptise M.D.   On: 10/31/2014 16:58      Assessment & Plan  1. Carinal mass with left lung collapse 2. Postobstructive pneumonia 3. Lung cancer status post surgery and chemotherapy 8 years ago 4. History of COPD  Plan  Admit the patient to stepdown unit IV Zosyn and Levaquin Solu-Medrol DuoNebs Chest physiotherapy Discussed with PCCM, who will evaluate the patient in a.m.    DVT Prophylaxis Lovenox  AM Labs Ordered, also please review Full Orders  Family Communication: Admission, patients condition and plan of care including tests being ordered have been discussed with the patient and daughter who indicate understanding and agree with the plan and Code Status.  Code Status full  Disposition Plan: Home  Time spent in minutes : 36 minutes  Condition GUARDED   '@SIGNATURE'$ @

## 2014-10-31 NOTE — ED Notes (Signed)
Pt transported to CT ?

## 2014-10-31 NOTE — Progress Notes (Signed)
ANTIBIOTIC CONSULT NOTE - INITIAL  Pharmacy Consult for Zosyn and Levaquin Indication: postobstructive pneumonia  No Known Allergies  Patient Measurements: Height: 5' (152.4 cm) Weight: 107 lb (48.535 kg) IBW/kg (Calculated) : 45.5  Vital Signs: Temp: 97.1 F (36.2 C) (10/12 1654) Temp Source: Rectal (10/12 1654) BP: 116/49 mmHg (10/12 2000) Pulse Rate: 95 (10/12 2000) Intake/Output from previous day:   Intake/Output from this shift: Total I/O In: 1000 [I.V.:1000] Out: -   Labs:  Recent Labs  10/31/14 1630 10/31/14 1641  WBC 11.1*  --   HGB 12.6  --   PLT 388  --   CREATININE 0.80 0.70   Estimated Creatinine Clearance: 43 mL/min (by C-G formula based on Cr of 0.7).  Microbiology: No results found for this or any previous visit (from the past 720 hour(s)).  Medical History: Past Medical History  Diagnosis Date  . Cancer ([place])     lung cancer  . COPD (chronic obstructive pulmonary disease) ([place])   . Asthma    Assessment: 53yof with hx lung CA s/p lobectomy and radiation 8 years ago presented to the ED with a 1 week history of SOB. CT chest showed a large subcarinal mass obstructing her left mainstem bronchus and almost complete collapse/consolidation of the rest of her left lung. She will begin antibiotics for postobstructive pneumonia. Renal function wnl.  Goal of Therapy:  Appropriate dosing  Plan:  1) Zosyn 3.375g IV q8 (4 hour infusion) 2) Levaquin '750mg'$  IV q48 3) Follow renal function, cultures, LOT  Deboraha Sprang 10/31/2014,8:09 PM

## 2014-10-31 NOTE — ED Notes (Signed)
Per ems- pt from home with c/o L upper abdominal pain/cp x 1 week. Today pain became worse and she was sob. Upon ems arrival pt o2 sats in 70's. Pt placed on NRB. Pt tachypneic. Capnography 26. Pt c/o nausea now. bp 140/82 p 103. Currently 86% on RA. Pt wth hx lung cancer and partial lung removal (pt does not know what side). Pt smokes daily.

## 2014-10-31 NOTE — ED Provider Notes (Signed)
CSN: [id number]     Arrival date & time 10/31/14  1605 History   First MD Initiated Contact with Patient 10/31/14 1608     Chief Complaint  Patient presents with  . Shortness of Breath  . Chest Pain   Tamara Key is a 74 y.o. female with a past medical history significant for lung cancer status post thoracic lobectomy surgery, COPD, and hypertension who presents with chest pain, shortness of breath and hypoxia. The patient reports that for the last week, she has had gradually worsening chest pain. The patient described the pain as severe, located in her central left chest, and nonradiating. The patient was feeling more short of breath today and when EMS arrived, they reported that her auction saturation was in the 70s on room air. The lipase the patient and nonrebreather improving her hypoxia. The patient was tachycardic, tachypneic, and clearly uncomfortable on arrival. The patient however denied any fevers, chills, productive cough, nausea, vomiting, constipation, diarrhea, dysuria. The patient denies any history of DVT or pulmonary embolism however, the patient does have a history of lung cancer. The patient says that nothing makes her pain better or worse and she denies any isolated leg pain or leg swelling. The patient is currently by her family.   (Consider location/radiation/quality/duration/timing/severity/associated sxs/prior Treatment) Patient is a 74 y.o. female presenting with chest pain. The history is provided by the patient, a relative, the EMS personnel and medical records. No language interpreter was used.  Chest Pain Pain location:  L chest and substernal area Pain quality comment:  Patient unable to describe quality of pain. Pain radiates to:  Does not radiate Pain radiates to the back: no   Pain severity:  Severe Onset quality:  Gradual Duration:  1 week Timing:  Constant Progression:  Waxing and waning Chronicity:  New Relieved by:  Oxygen Worsened by:  Deep  breathing (Patient reports pleuritic component to the pain) Ineffective treatments:  None tried Associated symptoms: diaphoresis and shortness of breath   Associated symptoms: no abdominal pain, no back pain, no cough, no dizziness, no fever, no headache, no nausea, no numbness, no palpitations, no syncope, not vomiting and no weakness   Shortness of breath:    Severity:  Severe   Onset quality:  Gradual   Duration:  1 week   Timing:  Constant   Progression:  Worsening Risk factors: no coronary artery disease, no diabetes mellitus, not female and no prior DVT/PE     No past medical history on file. No past surgical history on file. No family history on file. Social History  Substance Use Topics  . Smoking status: Not on file  . Smokeless tobacco: Not on file  . Alcohol Use: Not on file   OB History    No data available     Review of Systems  Constitutional: Positive for diaphoresis. Negative for fever, chills and appetite change.  HENT: Negative for congestion.   Eyes: Negative for visual disturbance.  Respiratory: Positive for shortness of breath. Negative for cough, chest tightness, wheezing and stridor.   Cardiovascular: Positive for chest pain. Negative for palpitations, leg swelling and syncope.  Gastrointestinal: Negative for nausea, vomiting, abdominal pain, diarrhea and constipation.  Genitourinary: Negative for dysuria, flank pain and difficulty urinating.  Musculoskeletal: Negative for back pain, neck pain and neck stiffness.  Skin: Negative for rash and wound.  Neurological: Negative for dizziness, weakness, numbness and headaches.  All other systems reviewed and are negative.     Allergies  Review of patient's allergies indicates not on file.  Home Medications   Prior to Admission medications   Not on File   BP 110/52 mmHg  Pulse 94  Temp(Src) 97.3 F (36.3 C) (Oral)  Resp 27  Ht 5' (1.524 m)  Wt 106 lb 0.7 oz (48.1 kg)  BMI 20.71 kg/m2  SpO2  98% Physical Exam  Constitutional: She is oriented to person, place, and time. She appears well-developed and well-nourished. No distress.  HENT:  Head: Normocephalic and atraumatic.  Mouth/Throat: No oropharyngeal exudate.  Eyes: Conjunctivae and EOM are normal. Pupils are equal, round, and reactive to light.  Neck: Normal range of motion.  Cardiovascular: Regular rhythm, normal heart sounds and intact distal pulses.  Tachycardia present.   No murmur heard. Pulmonary/Chest: No accessory muscle usage or stridor. Tachypnea noted. No bradypnea. She is in respiratory distress. She has no decreased breath sounds. She has no wheezes. She has rales in the right lower field. She exhibits no tenderness.  Abdominal: Soft. There is no tenderness. There is no rebound.  Musculoskeletal: She exhibits no tenderness.  Neurological: She is alert and oriented to person, place, and time. She exhibits normal muscle tone.  Skin: Skin is warm. She is not diaphoretic. No erythema.  Psychiatric: She has a normal mood and affect.  Nursing note and vitals reviewed.   ED Course  Procedures (including critical care time) Labs Review Labs Reviewed  CBC WITH DIFFERENTIAL/PLATELET - Abnormal; Notable for the following:    WBC 11.1 (*)    Neutro Abs 8.5 (*)    All other components within normal limits  COMPREHENSIVE METABOLIC PANEL - Abnormal; Notable for the following:    Sodium 130 (*)    Chloride 92 (*)    Glucose, Bld 147 (*)    Anion gap 16 (*)    All other components within normal limits  URINALYSIS, ROUTINE W REFLEX MICROSCOPIC (NOT AT [facility]) - Abnormal; Notable for the following:    APPearance HAZY (*)    Ketones, ur 15 (*)    All other components within normal limits  BRAIN NATRIURETIC PEPTIDE - Abnormal; Notable for the following:    B Natriuretic Peptide 106.3 (*)    All other components within normal limits  MRSA PCR SCREENING  URINE CULTURE  CULTURE, BLOOD (ROUTINE X 2)  CULTURE, BLOOD  (ROUTINE X 2)  CULTURE, EXPECTORATED SPUTUM-ASSESSMENT  GRAM STAIN  TSH  LEGIONELLA PNEUMOPHILA SEROGP 1 UR AG  STREP PNEUMONIAE URINARY ANTIGEN  BASIC METABOLIC PANEL  CBC  I-STAT CG4 LACTIC ACID, ED  I-STAT TROPOININ, ED  I-STAT CREATININE, ED  I-STAT CG4 LACTIC ACID, ED  I-STAT TROPOININ, ED  I-STAT TROPOININ, ED    Imaging Review Ct Angio Chest Pe W/cm &/or Wo Cm  10/31/2014  CLINICAL DATA:  Chest pain and left upper quadrant abdominal pain. Shortness of breath. Low O2 saturation. Nausea. History of lung cancer with left lobectomy. EXAM: CT ANGIOGRAPHY CHEST WITH CONTRAST TECHNIQUE: Multidetector CT imaging of the chest was performed using the standard protocol during bolus administration of intravenous contrast. Multiplanar CT image reconstructions and MIPs were obtained to evaluate the vascular anatomy. CONTRAST:  55m OMNIPAQUE IOHEXOL 350 MG/ML SOLN COMPARISON:  Chest x-ray dated 10/31/2014, 10/30/2014 and 12/29/2012 and chest CT dated 01/04/2012 FINDINGS: There are no pulmonary emboli. There is a 6 x 3 x 3 cm mass just below the carina extending into the left hilum obstructing the bronchus to the remaining portion of the left lung. This mass also  compresses the esophagus at the level of the carina. There is almost complete collapse/consolidation of the remaining portion of the left lung. There is either tumor or fluid in the pericardium to the left of midline. Fairly extensive coronary artery disease. Heart size is normal. Severe emphysema of the right upper lobe. Visualized portion of the upper abdomen demonstrates no acute abnormality. Chronic biliary ductal dilatation. Chronic a parapelvic cyst on the left kidney. Extensive aortic atherosclerosis. Chronic accentuation of the thoracic kyphosis. Review of the MIP images confirms the above findings. 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 Pericardial tumor or fluid adjacent to the mass. Almost complete  collapse/ consolidation of the remaining portion of the left lung. Severe emphysema in the right upper lobe. Electronically Signed   By: Lorriane Shire M.D.   On: 10/31/2014 18:20   Dg Chest Portable 1 View  10/31/2014  CLINICAL DATA:  Left upper abdominal pain, chest pain for 1 week. Worsening today. Shortness of breath. Low O2 sats. History of lung cancer EXAM: PORTABLE CHEST 1 VIEW COMPARISON:  10/30/2014 at [place]: Postoperative changes on the left. Worsening aeration on the left with decreasing lung volumes and increasing airspace opacity, presumably atelectasis with the worsening and volume loss. Right lung is clear. Heart is normal size. Underlying COPD. No visible effusions or acute bony abnormality. IMPRESSION: Worsening aeration on the left with increasing airspace disease and worsening volume loss, presumably related to atelectasis. COPD. Electronically Signed   By: Rolm Baptise M.D.   On: 10/31/2014 16:58   I have personally reviewed and evaluated these images and lab results as part of my medical decision-making.   EKG Interpretation   Date/Time:  Wednesday October 31 2014 16:11:01 EDT Ventricular Rate:  101 PR Interval:  135 QRS Duration: 130 QT Interval:  394 QTC Calculation: 511 R Axis:   108 Text Interpretation:  Sinus tachycardia Consider right atrial enlargement  RBBB and LPFB ST depr, consider ischemia, inferior leads Baseline wander  in lead(s) I II aVR aVF Confirmed by LIU MD, DANA ([id number]) on 10/31/2014  4:30:32 PM         EMERGENCY DEPARTMENT [place] CARDIAC EXAM "Study: Limited Ultrasound of the heart and pericardium"  INDICATIONS:Tachycardia Multiple views of the heart and pericardium were obtained in real-time with a multi-frequency probe.  PERFORMED KK:[id number]  IMAGES ARCHIVED?: Yes  FINDINGS: Small effusion, Hyperdynamic contractility and Tamponade physiology absent  LIMITATIONS:  Emergent procedure  VIEWS USED: Subcostal 4 chamber,  Parasternal long axis, Parasternal short axis and Apical 4 chamber   INTERPRETATION: Cardiac activity present, Pericardial effusion present, Cardiac tamponade absent and Increased contractility    MDM   SHELI DORIN is a 74 y.o. female with a past medical history significant for lung cancer status post thoracic lobectomy surgery, COPD, and hypertension who presents with chest pain, shortness of breath and hypoxia. Given the patient's history of cancer, her pleuritic chest pain, her hypoxia, tachycardia and tachypnea, hypoxia suspicion for pulmonary embolism. Although the patient denies fevers and chills or productive cough, pneumonia is also considered. Given the patient's age of 47, the patient will have laboratory, imaging, and EKG testing to evaluate for possible etiology of her symptoms.  The patient's initial EKG showed a sinus tachycardia. There were no other EKGs in the patient's chart however, there was some mild ST depressions in the inferior leads. There was Baseline wander.  The patient was given a dose of fentanyl to help with her pain, was given fluids  and Zofran for nausea which she developed.   A bedside echo was performed and a mild pericardial effusion was observed. There is no evidence of tamponade physiology appreciated. The patient had a hyperdynamic heart with an appearance of good squeeze.  The patient's chest x-ray showed concern for worsening airspace disease. The patient's laboratory testing revealed a mild leukocytosis. The patient was given antibiotics including Rocephin and azithromycin for possible community acquired pneumonia. A CT scan was obtained to look for PE given the persistent concern however, there was no evidence of PE found. There was however a large mass obstructing the left main bronchus.  Given the patient's mass obstructing her airway, the decision was made to admit the patient for further management. The patient was given Zosyn as there may be a  postobstructive type pneumonia.  Patient did not have any other problem or complications in the ED and the patient was admitted in stable condition.  This patient was seen with Dr. Oleta Mouse, emergency medicine attending.  Final diagnoses:  None        Tamara Blackbird, MD 11/01/14 0518  Tamara Dandy, MD 11/01/14 [phone]

## 2014-11-01 DIAGNOSIS — C3492 Malignant neoplasm of unspecified part of left bronchus or lung: Secondary | ICD-10-CM

## 2014-11-01 DIAGNOSIS — J189 Pneumonia, unspecified organism: Principal | ICD-10-CM

## 2014-11-01 DIAGNOSIS — J690 Pneumonitis due to inhalation of food and vomit: Secondary | ICD-10-CM

## 2014-11-01 LAB — BASIC METABOLIC PANEL
ANION GAP: 13 (ref 5–15)
BUN: 12 mg/dL (ref 6–20)
CHLORIDE: 88 mmol/L — AB (ref 101–111)
CO2: 25 mmol/L (ref 22–32)
Calcium: 8.9 mg/dL (ref 8.9–10.3)
Creatinine, Ser: 0.7 mg/dL (ref 0.44–1.00)
GFR calc non Af Amer: >60 mL/min (ref >60)
Glucose, Bld: 168 mg/dL — ABNORMAL HIGH (ref 65–99)
POTASSIUM: 3.5 mmol/L (ref 3.5–5.1)
SODIUM: 126 mmol/L — AB (ref 135–145)

## 2014-11-01 LAB — STREP PNEUMONIAE URINARY ANTIGEN: Strep Pneumo Urinary Antigen: NEGATIVE

## 2014-11-01 LAB — CBC
HEMATOCRIT: 37.1 % (ref 36.0–46.0)
HEMOGLOBIN: 12.9 g/dL (ref 12.0–15.0)
MCH: 30.7 pg (ref 26.0–34.0)
MCHC: 34.8 g/dL (ref 30.0–36.0)
MCV: 88.3 fL (ref 78.0–100.0)
Platelets: 327 10*3/uL (ref 150–400)
RBC: 4.2 MIL/uL (ref 3.87–5.11)
RDW: 13.2 % (ref 11.5–15.5)
WBC: 10.9 10*3/uL — AB (ref 4.0–10.5)

## 2014-11-01 LAB — URINE CULTURE: CULTURE: NO GROWTH

## 2014-11-01 LAB — MRSA PCR SCREENING: MRSA by PCR: NEGATIVE

## 2014-11-01 LAB — TSH: TSH: 0.847 u[IU]/mL (ref 0.350–4.500)

## 2014-11-01 MED ORDER — ALBUTEROL SULFATE (2.5 MG/3ML) 0.083% IN NEBU
2.5000 mg | INHALATION_SOLUTION | Freq: Three times a day (TID) | RESPIRATORY_TRACT | Status: DC
  Administered 2014-11-01 – 2014-11-03 (×7): 2.5 mg via RESPIRATORY_TRACT
  Filled 2014-11-01 (×7): qty 3

## 2014-11-01 MED ORDER — DEXTROSE 5 % IV SOLN
500.0000 mg | INTRAVENOUS | Status: DC
  Administered 2014-11-01 – 2014-11-04 (×4): 500 mg via INTRAVENOUS
  Filled 2014-11-01 (×5): qty 500

## 2014-11-01 MED ORDER — CETYLPYRIDINIUM CHLORIDE 0.05 % MT LIQD
7.0000 mL | Freq: Two times a day (BID) | OROMUCOSAL | Status: DC
  Administered 2014-11-01 – 2014-11-08 (×13): 7 mL via OROMUCOSAL

## 2014-11-01 MED ORDER — SODIUM CHLORIDE 0.9 % IV SOLN
INTRAVENOUS | Status: DC
  Administered 2014-11-01: 15:00:00 via INTRAVENOUS

## 2014-11-01 MED ORDER — ACETYLCYSTEINE 20 % IN SOLN
3.0000 mL | Freq: Three times a day (TID) | RESPIRATORY_TRACT | Status: DC
  Administered 2014-11-01 – 2014-11-02 (×3): 3 mL via RESPIRATORY_TRACT
  Filled 2014-11-01 (×7): qty 4

## 2014-11-01 MED ORDER — ENSURE ENLIVE PO LIQD
237.0000 mL | Freq: Three times a day (TID) | ORAL | Status: DC
  Administered 2014-11-01 – 2014-11-08 (×19): 237 mL via ORAL

## 2014-11-01 MED ORDER — DEXTROSE 5 % IV SOLN
1.0000 g | INTRAVENOUS | Status: DC
  Administered 2014-11-01 – 2014-11-07 (×7): 1 g via INTRAVENOUS
  Filled 2014-11-01 (×7): qty 10

## 2014-11-01 NOTE — Progress Notes (Addendum)
Patient Demographics  Tamara Key, is a 74 y.o. female, DOB - 09/25/1940, FWY:[id number]  Admit date - 10/31/2014   Admitting Physician Merton Border, MD  Outpatient Primary MD for the patient is No PCP Per Patient  LOS - 1   Chief Complaint  Patient presents with  . Shortness of Breath  . Chest Pain         Subjective:   Tamara Key today has, No headache, No chest pain, No abdominal pain - No Nausea, still complains of cough, reports it is nonproductive.   Assessment & Plan    Active Problems:   Pneumonia  Postobstructive pneumonia - Questionable mucous plugging versus left carinal mass, and chest x-ray with no acute findings, so this is most likely acute process related more to mucus plugging than actual mass. - Continue with pulmonary toilet including chest PT, rest, Mucinex, and then repeat chest x-ray in 2 days, may need repeat CT chest, possible need for bronchoscopy as well. - Continue with IV Rocephin and azithromycin for CAP coverage.   COPD  - No active wheezing, continue with nebs as needed , DC IV steroids  Hypertension  - Continue with amlodipine   Hyponatremia - Most likely related to  pulmonary process,  will start on gentle hydration .  Code Status: Full   Family Communication: Discussed with son and daughter at bedside   Disposition Plan: Pending further workup    Procedures    Consults   PCCM   Medications  Scheduled Meds: . acetylcysteine  3 mL Nebulization TID  . albuterol  2.5 mg Nebulization TID  . amLODipine  5 mg Oral Daily  . antiseptic oral rinse  7 mL Mouth Rinse BID  . azithromycin  500 mg Intravenous Q24H  . cefTRIAXone (ROCEPHIN)  IV  1 g Intravenous Q24H  . enoxaparin (LOVENOX) injection  30 mg Subcutaneous Q24H  . feeding supplement (ENSURE ENLIVE)  237 mL Oral TID BM  . guaiFENesin  1,200 mg Oral BID  . sodium chloride  3 mL  Intravenous Q12H  . sodium chloride  3 mL Intravenous Q12H   Continuous Infusions:  PRN Meds:.sodium chloride, acetaminophen **OR** acetaminophen, albuterol, HYDROcodone-acetaminophen, morphine injection, ondansetron **OR** ondansetron (ZOFRAN) IV, sodium chloride, zolpidem  DVT Prophylaxis  Lovenox -   Lab Results  Component Value Date   PLT 327 11/01/2014    Antibiotics    Anti-infectives    Start     Dose/Rate Route Frequency Ordered Stop   11/01/14 2200  azithromycin (ZITHROMAX) 500 mg in dextrose 5 % 250 mL IVPB     500 mg 250 mL/hr over 60 Minutes Intravenous Every 24 hours 11/01/14 1015     11/01/14 1100  cefTRIAXone (ROCEPHIN) 1 g in dextrose 5 % 50 mL IVPB     1 g 100 mL/hr over 30 Minutes Intravenous Every 24 hours 11/01/14 1019     11/01/14 0100  piperacillin-tazobactam (ZOSYN) IVPB 3.375 g  Status:  Discontinued     3.375 g 12.5 mL/hr over 240 Minutes Intravenous 3 times per day 10/31/14 2015 11/01/14 1015   10/31/14 2200  levofloxacin (LEVAQUIN) IVPB 750 mg  Status:  Discontinued     750 mg 100 mL/hr over 90 Minutes Intravenous Every 48  hours 10/31/14 2015 11/01/14 1015   10/31/14 2030  piperacillin-tazobactam (ZOSYN) IVPB 3.375 g  Status:  Discontinued     3.375 g 100 mL/hr over 30 Minutes Intravenous  Once 10/31/14 2015 11/01/14 0023   10/31/14 1915  piperacillin-tazobactam (ZOSYN) IVPB 3.375 g  Status:  Discontinued     3.375 g 100 mL/hr over 30 Minutes Intravenous  Once 10/31/14 1902 10/31/14 2013   10/31/14 1730  cefTRIAXone (ROCEPHIN) 1 g in dextrose 5 % 50 mL IVPB     1 g 100 mL/hr over 30 Minutes Intravenous  Once 10/31/14 1724 10/31/14 2001   10/31/14 1730  azithromycin (ZITHROMAX) 500 mg in dextrose 5 % 250 mL IVPB     500 mg 250 mL/hr over 60 Minutes Intravenous  Once 10/31/14 1724 10/31/14 2121          Objective:   Filed Vitals:   11/01/14 0344 11/01/14 0600 11/01/14 0700 11/01/14 0949  BP: 131/37 126/55 112/42   Pulse: 94 78 83   Temp:  97.5 F (36.4 C)  97.7 F (36.5 C)   TempSrc: Oral  Oral   Resp: '18 17 18   '$ Height:      Weight:      SpO2: 96% 100% 100% 96%    Wt Readings from Last 3 Encounters:  10/31/14 48.1 kg (106 lb 0.7 oz)     Intake/Output Summary (Last 24 hours) at 11/01/14 1249 Last data filed at 11/01/14 0630  Gross per 24 hour  Intake   1000 ml  Output    375 ml  Net    625 ml     Physical Exam  Awake Alert, Oriented X 3,  Supple Neck,No JVD,  Symmetrical Chest wall movement, decreased air entry on the left, no active wheezing No Gallops,Rubs or new Murmurs, No Parasternal Heave +ve B.Sounds, Abd Soft, No tenderness, No organomegaly appriciated, No rebound - guarding or rigidity. No Cyanosis, Clubbing or edema, No new Rash or bruise     Data Review   Micro Results Recent Results (from the past 240 hour(s))  Urine culture     Status: None (Preliminary result)   Collection Time: 10/31/14  5:44 PM  Result Value Ref Range Status   Specimen Description URINE, CATHETERIZED  Final   Special Requests NONE  Final   Culture NO GROWTH < 24 HOURS  Final   Report Status PENDING  Incomplete  MRSA PCR Screening     Status: None   Collection Time: 10/31/14 10:20 PM  Result Value Ref Range Status   MRSA by PCR NEGATIVE NEGATIVE Final    Comment:        The GeneXpert MRSA Assay (FDA approved for NASAL specimens only), is one component of a comprehensive MRSA colonization surveillance program. It is not intended to diagnose MRSA infection nor to guide or monitor treatment for MRSA infections.     Radiology Reports Ct Angio Chest Pe W/cm &/or Wo Cm  10/31/2014  CLINICAL DATA:  Chest pain and left upper quadrant abdominal pain. Shortness of breath. Low O2 saturation. Nausea. History of lung cancer with left lobectomy. EXAM: CT ANGIOGRAPHY CHEST WITH CONTRAST TECHNIQUE: Multidetector CT imaging of the chest was performed using the standard protocol during bolus administration of intravenous  contrast. Multiplanar CT image reconstructions and MIPs were obtained to evaluate the vascular anatomy. CONTRAST:  12m OMNIPAQUE IOHEXOL 350 MG/ML SOLN COMPARISON:  Chest x-ray dated 10/31/2014, 10/30/2014 and 12/29/2012 and chest CT dated 01/04/2012 FINDINGS: There are no pulmonary  emboli. There is a 6 x 3 x 3 cm mass just below the carina extending into the left hilum obstructing the bronchus to the remaining portion of the left lung. This mass also compresses the esophagus at the level of the carina. There is almost complete collapse/consolidation of the remaining portion of the left lung. There is either tumor or fluid in the pericardium to the left of midline. Fairly extensive coronary artery disease. Heart size is normal. Severe emphysema of the right upper lobe. Visualized portion of the upper abdomen demonstrates no acute abnormality. Chronic biliary ductal dilatation. Chronic a parapelvic cyst on the left kidney. Extensive aortic atherosclerosis. Chronic accentuation of the thoracic kyphosis. Review of the MIP images confirms the above findings. 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 Pericardial tumor or fluid adjacent to the mass. Almost complete collapse/ consolidation of the remaining portion of the left lung. Severe emphysema in the right upper lobe. Electronically Signed   By: Lorriane Shire M.D.   On: 10/31/2014 18:20   Dg Chest Portable 1 View  10/31/2014  CLINICAL DATA:  Left upper abdominal pain, chest pain for 1 week. Worsening today. Shortness of breath. Low O2 sats. History of lung cancer EXAM: PORTABLE CHEST 1 VIEW COMPARISON:  10/30/2014 at [place]: Postoperative changes on the left. Worsening aeration on the left with decreasing lung volumes and increasing airspace opacity, presumably atelectasis with the worsening and volume loss. Right lung is clear. Heart is normal size. Underlying COPD. No visible effusions or acute bony  abnormality. IMPRESSION: Worsening aeration on the left with increasing airspace disease and worsening volume loss, presumably related to atelectasis. COPD. Electronically Signed   By: Rolm Baptise M.D.   On: 10/31/2014 16:58     CBC  Recent Labs Lab 10/31/14 1630 11/01/14 0545  WBC 11.1* 10.9*  HGB 12.6 12.9  HCT 37.2 37.1  PLT 388 327  MCV 88.6 88.3  MCH 30.0 30.7  MCHC 33.9 34.8  RDW 13.3 13.2  LYMPHSABS 1.5  --   MONOABS 1.0  --   EOSABS 0.0  --   BASOSABS 0.0  --     Chemistries   Recent Labs Lab 10/31/14 1630 10/31/14 1641 11/01/14 0545  NA 130*  --  126*  K 4.0  --  3.5  CL 92*  --  88*  CO2 22  --  25  GLUCOSE 147*  --  168*  BUN 16  --  12  CREATININE 0.80 0.70 0.70  CALCIUM 10.2  --  8.9  AST 27  --   --   ALT 14  --   --   ALKPHOS 96  --   --   BILITOT 0.6  --   --    ------------------------------------------------------------------------------------------------------------------ estimated creatinine clearance is 43 mL/min (by C-G formula based on Cr of 0.7). ------------------------------------------------------------------------------------------------------------------ No results for input(s): HGBA1C in the last 72 hours. ------------------------------------------------------------------------------------------------------------------ No results for input(s): CHOL, HDL, LDLCALC, TRIG, CHOLHDL, LDLDIRECT in the last 72 hours. ------------------------------------------------------------------------------------------------------------------  Recent Labs  10/31/14 2250  TSH 0.847   ------------------------------------------------------------------------------------------------------------------ No results for input(s): VITAMINB12, FOLATE, FERRITIN, TIBC, IRON, RETICCTPCT in the last 72 hours.  Coagulation profile No results for input(s): INR, PROTIME in the last 168 hours.  No results for input(s): DDIMER in the last 72 hours.  Cardiac  Enzymes No results for input(s): CKMB, TROPONINI, MYOGLOBIN in the last 168 hours.  Invalid input(s): CK ------------------------------------------------------------------------------------------------------------------ Invalid input(s): POCBNP     Time Spent  in minutes   40 minutes   Tab Rylee M.D on 11/01/2014 at 12:49 PM  Between 7am to 7pm - Pager - [phone]  After 7pm go to [email or web address] - password [facility]  [facility]   Office  [phone]

## 2014-11-01 NOTE — Progress Notes (Addendum)
ANTIBIOTIC CONSULT NOTE  Pharmacy Consult for  rocephin Indication: CAP  No Known Allergies  Patient Measurements: Height: 5' (152.4 cm) Weight: 106 lb 0.7 oz (48.1 kg) IBW/kg (Calculated) : 45.5  Vital Signs: Temp: 97.7 F (36.5 C) (10/13 0700) Temp Source: Oral (10/13 0700) BP: 112/42 mmHg (10/13 0700) Pulse Rate: 83 (10/13 0700) Intake/Output from previous day: 10/12 0701 - 10/13 0700 In: 1000 [I.V.:1000] Out: 375 [Urine:375] Intake/Output from this shift:    Labs:  Recent Labs  10/31/14 1630 10/31/14 1641 11/01/14 0545  WBC 11.1*  --  10.9*  HGB 12.6  --  12.9  PLT 388  --  327  CREATININE 0.80 0.70 0.70   Estimated Creatinine Clearance: 43 mL/min (by C-G formula based on Cr of 0.7). No results for input(s): VANCOTROUGH, VANCOPEAK, VANCORANDOM, GENTTROUGH, GENTPEAK, GENTRANDOM, TOBRATROUGH, TOBRAPEAK, TOBRARND, AMIKACINPEAK, AMIKACINTROU, AMIKACIN in the last 72 hours.   Microbiology: Recent Results (from the past 720 hour(s))  Urine culture     Status: None (Preliminary result)   Collection Time: 10/31/14  5:44 PM  Result Value Ref Range Status   Specimen Description URINE, CATHETERIZED  Final   Special Requests NONE  Final   Culture NO GROWTH < 24 HOURS  Final   Report Status PENDING  Incomplete  MRSA PCR Screening     Status: None   Collection Time: 10/31/14 10:20 PM  Result Value Ref Range Status   MRSA by PCR NEGATIVE NEGATIVE Final    Comment:        The GeneXpert MRSA Assay (FDA approved for NASAL specimens only), is one component of a comprehensive MRSA colonization surveillance program. It is not intended to diagnose MRSA infection nor to guide or monitor treatment for MRSA infections.     Medical History: Past Medical History  Diagnosis Date  . Cancer ([place])     lung cancer  . COPD (chronic obstructive pulmonary disease) ([place])   . Asthma     Medications:  Scheduled:  . amLODipine  5 mg Oral Daily  . antiseptic oral rinse  7 mL  Mouth Rinse BID  . azithromycin  500 mg Intravenous Q24H  . cefTRIAXone (ROCEPHIN)  IV  1 g Intravenous Q24H  . enoxaparin (LOVENOX) injection  30 mg Subcutaneous Q24H  . feeding supplement (ENSURE ENLIVE)  237 mL Oral TID BM  . guaiFENesin  1,200 mg Oral BID  . ipratropium-albuterol  3 mL Nebulization Q6H  . methylPREDNISolone (SOLU-MEDROL) injection  60 mg Intravenous Q6H  . sodium chloride  3 mL Intravenous Q12H  . sodium chloride  3 mL Intravenous Q12H    Assessment: 74 yo female with NSCLC and with post-obstructive PNA on zosyn and levaquin and for now changing to rocephin/azithromycin. Pharmacy has been consulted to dose rocephin. -WBC= 10.9, afeb  10/13 rocephin>> 10/12 azithromycin> 10/13 zosyn>> 10/13 10/12 levaquin>> 10/13  10/12 urine- ngtd 10/12 blood x2  Plan:  -Rocephin 1gm IV q24hr (no adjustments for renal function needed) -Will sign off for now. Please contact pharmacy with any other needs  Thank you, Hildred Laser, Pharm D 11/01/2014 10:24 AM

## 2014-11-01 NOTE — Progress Notes (Signed)
Unable to do CPT at 4 .Marland Kitchen Did CPT with  Audelia Hives percussor  Pt head elevated because gets SOB when flat.  Pt seemed exhausted after treatment

## 2014-11-01 NOTE — Consult Note (Signed)
Name: Tamara Key MRN: [id number] DOB: 04/14/1940    ADMISSION DATE:  10/31/2014 CONSULTATION DATE:  11/01/14  REFERRING MD :  Dr. Waldron Labs  CHIEF COMPLAINT:  Carinal Mass/Post-Obstructive PNA  BRIEF PATIENT DESCRIPTION: 74 y/o F, current smoker, with a PMH of COPD and lung cancer s/p resection and chemotherapy x3 in 2009 (Dr. Arlyce Dice, followed by Dr. Hinton Rao in [place]) who presented to [facility] on 10/12 with L sided chest pain & SOB for one week.  Work up concerning for left subcarinal mass with post-obstructive PNA.  PCCM consulted.   SIGNIFICANT EVENTS  04/2007  Last seen by Dr. Arlyce Dice for follow up CXR after resection and chemotherapy  10/12  Admit to [facility] with 1 week hx of chest pain & SOB.  Work up concerning for L subcarinal mass and post obstructive PNA  STUDIES:  10/12  CTA Chest >> no evidence of PE, large subcarinal mass (6x3x3 cm) obstructing the left mainstem bronchus, pericardial tumor or fluid adjacent to the mass, almost complete collapse/consolidation of the remaining portion of the lung, severe emphysema in RUL   HISTORY OF PRESENT ILLNESS: 74 y/o F, current smoker, with a PMH of HTN, COPD and lung cancer s/p partial lobectomy  and chemotherapy x3 in 2009 (Dr. Arlyce Dice, followed by Dr. Hinton Rao in [place]) who presented to [facility] on 10/12 with L sided chest pain & SOB for one week.    The patient presented via EMS and was noted to have increased work of breathing & hypoxic with saturations in the 70's on their arrival. She was placed on 100% NRB and saturations increased to mid 80's.  Per notes, the patient reported a one week history of chest pain and shortness of breath.  She continues to smoke and lives alone.  The patient is a poor historian but denies fevers, chills, nausea / vomiting, diarrhea.  She indicates she does have some daily sputum production but it is unchanged, weight loss but she is uncertain of how much (reports at one point was up to 160 lbs but now is 106) and is  "always cold since chemotherapy".  Denies fevers/chills.  Initial CXR showed a large opacification on the left, airway shift to the left and postsurgical changes.  She was further evaluated with a CT of the chest which showed no evidence of PE, large subcarinal mass (6x3x3 cm) obstructing the left mainstem bronchus, pericardial tumor or fluid adjacent to the mass, almost complete collapse/consolidation of the remaining portion of the lung, severe emphysema in RUL.  The patient was admitted per [facility] and treated for post obstructive PNA.    PCCM consulted for pulmonary evaluation.   PAST MEDICAL HISTORY :   has a past medical history of Cancer ([place]); COPD (chronic obstructive pulmonary disease) ([place]); and Asthma.  has past surgical history that includes Lung removal, partial.   Prior to Admission medications   Medication Sig Start Date End Date Taking? Authorizing Provider  amLODipine (NORVASC) 5 MG tablet Take 5 mg by mouth daily. 07/27/14  Yes Historical Provider, MD  azithromycin (ZITHROMAX) 250 MG tablet as directed. 5 day dosepack 10/30/14  Yes Historical Provider, MD  COMBIVENT RESPIMAT 20-100 MCG/ACT AERS respimat Inhale 1 puff into the lungs 4 (four) times daily. 10/17/14  Yes Historical Provider, MD   No Known Allergies  FAMILY HISTORY:  family history is not on file.   SOCIAL HISTORY:  reports that she has been smoking.  She does not have any smokeless tobacco history on file.  REVIEW OF SYSTEMS:  - POOR HISTORIAN. Oriented to self and remote history but unclear of time, events.    Constitutional: Negative for fever, chills, malaise/fatigue and diaphoresis. Reports un-quantified amount of weight loss and decreased appetite.   HENT: Negative for hearing loss, ear pain, nosebleeds, congestion, sore throat, neck pain, tinnitus and ear discharge.   Eyes: Negative for blurred vision, double vision, photophobia, pain, discharge and redness.  Respiratory: Negative for cough, hemoptysis,  wheezing and stridor.  Reports daily sputum production that is unchanged, shortness of breath and left sided chest pain.  Cardiovascular: Negative for palpitations, orthopnea, claudication, leg swelling and PND.  Gastrointestinal: Negative for heartburn, nausea, vomiting, abdominal pain, diarrhea, constipation, blood in stool and melena.  Genitourinary: Negative for dysuria, urgency, frequency, hematuria and flank pain.  Musculoskeletal: Negative for myalgias, back pain, joint pain and falls.  Skin: Negative for itching and rash.  Neurological: Negative for dizziness, tingling, tremors, sensory change, speech change, focal weakness, seizures, loss of consciousness, weakness and headaches.  Endo/Heme/Allergies: Negative for environmental allergies and polydipsia. Does not bruise/bleed easily.  SUBJECTIVE:   VITAL SIGNS: Temp:  [97.1 F (36.2 C)-97.7 F (36.5 C)] 97.7 F (36.5 C) (10/13 0700) Pulse Rate:  [78-104] 83 (10/13 0700) Resp:  [17-35] 18 (10/13 0700) BP: (107-148)/(37-68) 112/42 mmHg (10/13 0700) SpO2:  [86 %-100 %] 100 % (10/13 0700) FiO2 (%):  [4 %] 4 % (10/12 2348) Weight:  [106 lb 0.7 oz (48.1 kg)-107 lb (48.535 kg)] 106 lb 0.7 oz (48.1 kg) (10/12 2130)  PHYSICAL EXAMINATION: General:  Thin elderly female in NAD Neuro:  Awake, alert, oriented to self and remote past.  Unable to state the year or where she is HEENT:  MM pink/moist, no jvd  Cardiovascular:  s1s2 rrr, no m/r/g, distant tones  Lungs:  Even/non-labored at rest, becomes dyspneic with exertion (sitting up for auscultation), lungs diminished L>R Abdomen:  Flat, soft, bsx4 active  Musculoskeletal:  No acute deformities  Skin:  Warm/dry, no edema    Recent Labs Lab 10/31/14 1630 10/31/14 1641 11/01/14 0545  NA 130*  --  126*  K 4.0  --  3.5  CL 92*  --  88*  CO2 22  --  25  BUN 16  --  12  CREATININE 0.80 0.70 0.70  GLUCOSE 147*  --  168*    Recent Labs Lab 10/31/14 1630 11/01/14 0545  HGB 12.6  12.9  HCT 37.2 37.1  WBC 11.1* 10.9*  PLT 388 327   Ct Angio Chest Pe W/cm &/or Wo Cm  10/31/2014  CLINICAL DATA:  Chest pain and left upper quadrant abdominal pain. Shortness of breath. Low O2 saturation. Nausea. History of lung cancer with left lobectomy. EXAM: CT ANGIOGRAPHY CHEST WITH CONTRAST TECHNIQUE: Multidetector CT imaging of the chest was performed using the standard protocol during bolus administration of intravenous contrast. Multiplanar CT image reconstructions and MIPs were obtained to evaluate the vascular anatomy. CONTRAST:  4m OMNIPAQUE IOHEXOL 350 MG/ML SOLN COMPARISON:  Chest x-ray dated 10/31/2014, 10/30/2014 and 12/29/2012 and chest CT dated 01/04/2012 FINDINGS: There are no pulmonary emboli. There is a 6 x 3 x 3 cm mass just below the carina extending into the left hilum obstructing the bronchus to the remaining portion of the left lung. This mass also compresses the esophagus at the level of the carina. There is almost complete collapse/consolidation of the remaining portion of the left lung. There is either tumor or fluid in the pericardium to the left of  midline. Fairly extensive coronary artery disease. Heart size is normal. Severe emphysema of the right upper lobe. Visualized portion of the upper abdomen demonstrates no acute abnormality. Chronic biliary ductal dilatation. Chronic a parapelvic cyst on the left kidney. Extensive aortic atherosclerosis. Chronic accentuation of the thoracic kyphosis. Review of the MIP images confirms the above findings. 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 Pericardial tumor or fluid adjacent to the mass. Almost complete collapse/ consolidation of the remaining portion of the left lung. Severe emphysema in the right upper lobe. Electronically Signed   By: Lorriane Shire M.D.   On: 10/31/2014 18:20   Dg Chest Portable 1 View  10/31/2014  CLINICAL DATA:  Left upper abdominal pain, chest pain for 1 week.  Worsening today. Shortness of breath. Low O2 sats. History of lung cancer EXAM: PORTABLE CHEST 1 VIEW COMPARISON:  10/30/2014 at [place]: Postoperative changes on the left. Worsening aeration on the left with decreasing lung volumes and increasing airspace opacity, presumably atelectasis with the worsening and volume loss. Right lung is clear. Heart is normal size. Underlying COPD. No visible effusions or acute bony abnormality. IMPRESSION: Worsening aeration on the left with increasing airspace disease and worsening volume loss, presumably related to atelectasis. COPD. Electronically Signed   By: Rolm Baptise M.D.   On: 10/31/2014 16:58    ASSESSMENT / PLAN:   Large Subcarinal Mass - new 6x3x3 cm mass on CT, concerning for malignant recurrence vs mucus plugging.  Initial CXR 10/11 at [facility] was not far off from baseline film post lobectomy.  10/12 imaging now shows significant change with left sided opacity and volume loss raises concern for acute process like mucus plugging.  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  - Mucomyst x 48 hours - Chest vest x 48 hours - CXR in am 10/14 - Plan to repeat CT over the weekend if CXR imaging has not cleared.    - ABX per primary SVC, change CAP coverage  - Ensure TID    Attempted to call her family with busy response.  Will discuss case with MD for further plan of care.   Noe Gens, NP-C [facility] Pgr: [phone] or if no answer [phone] 11/01/2014, 9:38 AM

## 2014-11-01 NOTE — Care Management Note (Signed)
Case Management Note  Patient Details  Name: Tamara Key MRN: [id number] Date of Birth: 05/15/1940  Subjective/Objective:        Adm w pneumonia            Action/Plan: lives at home   Expected Discharge Date:                  Expected Discharge Plan:     In-House Referral:     Discharge planning Services     Post Acute Care Choice:    Choice offered to:     DME Arranged:    DME Agency:     HH Arranged:    [place] Agency:     Status of Service:     Medicare Important Message Given:    Date Medicare IM Given:    Medicare IM give by:    Date Additional Medicare IM Given:    Additional Medicare Important Message give by:     If discussed at [place] of Stay Meetings, dates discussed:    Additional Comments: ur review done  Lacretia Leigh, RN 11/01/2014, 9:39 AM

## 2014-11-02 ENCOUNTER — Encounter (HOSPITAL_COMMUNITY): Payer: Self-pay | Admitting: Internal Medicine

## 2014-11-02 ENCOUNTER — Inpatient Hospital Stay (HOSPITAL_COMMUNITY): Payer: Medicare HMO

## 2014-11-02 DIAGNOSIS — E871 Hypo-osmolality and hyponatremia: Secondary | ICD-10-CM

## 2014-11-02 DIAGNOSIS — J449 Chronic obstructive pulmonary disease, unspecified: Secondary | ICD-10-CM

## 2014-11-02 DIAGNOSIS — R0902 Hypoxemia: Secondary | ICD-10-CM | POA: Insufficient documentation

## 2014-11-02 DIAGNOSIS — J438 Other emphysema: Secondary | ICD-10-CM

## 2014-11-02 DIAGNOSIS — I10 Essential (primary) hypertension: Secondary | ICD-10-CM

## 2014-11-02 DIAGNOSIS — J9819 Other pulmonary collapse: Secondary | ICD-10-CM

## 2014-11-02 HISTORY — DX: Essential (primary) hypertension: I10

## 2014-11-02 LAB — LEGIONELLA PNEUMOPHILA SEROGP 1 UR AG: L. pneumophila Serogp 1 Ur Ag: NEGATIVE

## 2014-11-02 LAB — OSMOLALITY, URINE: Osmolality, Ur: 453 mOsm/kg (ref 390–1090)

## 2014-11-02 LAB — URINALYSIS, ROUTINE W REFLEX MICROSCOPIC
BILIRUBIN URINE: NEGATIVE
Glucose, UA: NEGATIVE mg/dL
HGB URINE DIPSTICK: NEGATIVE
Ketones, ur: 15 mg/dL — AB
Leukocytes, UA: NEGATIVE
Nitrite: NEGATIVE
PH: 5.5 (ref 5.0–8.0)
Protein, ur: NEGATIVE mg/dL
SPECIFIC GRAVITY, URINE: 1.021 (ref 1.005–1.030)
UROBILINOGEN UA: 0.2 mg/dL (ref 0.0–1.0)

## 2014-11-02 LAB — BASIC METABOLIC PANEL
Anion gap: 13 (ref 5–15)
BUN: 39 mg/dL — ABNORMAL HIGH (ref 6–20)
CALCIUM: 9.5 mg/dL (ref 8.9–10.3)
CO2: 27 mmol/L (ref 22–32)
CREATININE: 1.24 mg/dL — AB (ref 0.44–1.00)
Chloride: 90 mmol/L — ABNORMAL LOW (ref 101–111)
GFR calc non Af Amer: 41 mL/min — ABNORMAL LOW (ref >60)
GFR, EST AFRICAN AMERICAN: 48 mL/min — AB (ref >60)
Glucose, Bld: 82 mg/dL (ref 65–99)
Potassium: 3.8 mmol/L (ref 3.5–5.1)
SODIUM: 130 mmol/L — AB (ref 135–145)

## 2014-11-02 LAB — CBC
HCT: 36.2 % (ref 36.0–46.0)
Hemoglobin: 12.4 g/dL (ref 12.0–15.0)
MCH: 30.5 pg (ref 26.0–34.0)
MCHC: 34.3 g/dL (ref 30.0–36.0)
MCV: 88.9 fL (ref 78.0–100.0)
PLATELETS: 359 10*3/uL (ref 150–400)
RBC: 4.07 MIL/uL (ref 3.87–5.11)
RDW: 13.4 % (ref 11.5–15.5)
WBC: 16.4 10*3/uL — ABNORMAL HIGH (ref 4.0–10.5)

## 2014-11-02 LAB — SODIUM, URINE, RANDOM: SODIUM UR: 39 mmol/L

## 2014-11-02 MED ORDER — ACETYLCYSTEINE 20 % IN SOLN
3.0000 mL | Freq: Two times a day (BID) | RESPIRATORY_TRACT | Status: DC
  Filled 2014-11-02: qty 4

## 2014-11-02 MED ORDER — ACETYLCYSTEINE 20 % IN SOLN
4.0000 mL | Freq: Two times a day (BID) | RESPIRATORY_TRACT | Status: AC
  Administered 2014-11-02 – 2014-11-05 (×7): 4 mL via RESPIRATORY_TRACT
  Filled 2014-11-02 (×7): qty 4

## 2014-11-02 MED ORDER — MIDAZOLAM HCL 2 MG/2ML IJ SOLN
1.0000 mg | INTRAMUSCULAR | Status: DC | PRN
  Administered 2014-11-02 – 2014-11-06 (×5): 1 mg via INTRAVENOUS
  Filled 2014-11-02 (×6): qty 2

## 2014-11-02 MED ORDER — SODIUM CHLORIDE 0.9 % IV SOLN
INTRAVENOUS | Status: DC
  Administered 2014-11-02: 75 mL/h via INTRAVENOUS
  Administered 2014-11-03 – 2014-11-04 (×2): via INTRAVENOUS

## 2014-11-02 NOTE — Care Management Important Message (Signed)
Important Message  Patient Details  Name: Tamara Key MRN: [id number] Date of Birth: [birth year]   Medicare Important Message Given:  Yes-second notification given    [name] 11/02/2014, 10:55 AM

## 2014-11-02 NOTE — Consult Note (Signed)
Name: Tamara Key MRN: [id number] DOB: [birth year]    ADMISSION DATE:  10/31/2014 CONSULTATION DATE:  11/01/14  REFERRING MD :  Dr. Waldron Labs  CHIEF COMPLAINT:  Carinal Mass/Post-Obstructive PNA  BRIEF PATIENT DESCRIPTION: 74 y/o F, current smoker, with a PMH of COPD and lung cancer s/p resection and chemotherapy x3 in 2009 (Dr. Arlyce Dice, followed by Dr. Hinton Rao in [place]) who presented to [facility] on 10/12 with L sided chest pain & SOB for one week.  Work up concerning for left subcarinal mass with post-obstructive PNA.  PCCM consulted.   SIGNIFICANT EVENTS  04/2007  Last seen by Dr. Arlyce Dice for follow up CXR after resection and chemotherapy  10/12  Admit to [facility] with 1 week hx of chest pain & SOB.  Work up concerning for L subcarinal mass and post obstructive PNA  STUDIES:  10/12  CTA Chest >> no evidence of PE, large subcarinal mass (6x3x3 cm) obstructing the left mainstem bronchus, pericardial tumor or fluid adjacent to the mass, almost complete collapse/consolidation of the remaining portion of the lung, severe emphysema in RUL gies and polydipsia.  SUBJECTIVE: some SOB on potty  VITAL SIGNS: Temp:  [97.6 F (36.4 C)-98.6 F (37 C)] 97.9 F (36.6 C) (10/14 1200) Pulse Rate:  [83-120] 104 (10/14 1239) Resp:  [12-32] 22 (10/14 1239) BP: (94-123)/(33-47) 119/40 mmHg (10/14 1239) SpO2:  [88 %-98 %] 93 % (10/14 1320) FiO2 (%):  [35 %-40 %] 40 % (10/14 1320)  PHYSICAL EXAMINATION: General:  Thin elderly female in mild distress on potty seat Neuro:  Awake, alert, oriented to self and remote past.  HEENT:  MM pink/moist, no jvd  Cardiovascular:  s1s2 rrr, no m/r/g, distant tones  Lungs NO egoph, coarse and slight reduced left Abdomen:  Flat, soft, bsx4 active  Musculoskeletal:  No acute deformities  Skin:  Warm/dry, no edema    Recent Labs Lab 10/31/14 1630 10/31/14 1641 11/01/14 0545 11/02/14 0243  NA 130*  --  126* 130*  K 4.0  --  3.5 3.8  CL 92*  --  88* 90*  CO2 22   --  25 27  BUN 16  --  12 39*  CREATININE 0.80 0.70 0.70 1.24*  GLUCOSE 147*  --  168* 82    Recent Labs Lab 10/31/14 1630 11/01/14 0545 11/02/14 0243  HGB 12.6 12.9 12.4  HCT 37.2 37.1 36.2  WBC 11.1* 10.9* 16.4*  PLT 388 327 359   Ct Angio Chest Pe W/cm &/or Wo Cm  10/31/2014  CLINICAL DATA:  Chest pain and left upper quadrant abdominal pain. Shortness of breath. Low O2 saturation. Nausea. History of lung cancer with left lobectomy. EXAM: CT ANGIOGRAPHY CHEST WITH CONTRAST TECHNIQUE: Multidetector CT imaging of the chest was performed using the standard protocol during bolus administration of intravenous contrast. Multiplanar CT image reconstructions and MIPs were obtained to evaluate the vascular anatomy. CONTRAST:  62m OMNIPAQUE IOHEXOL 350 MG/ML SOLN COMPARISON:  Chest x-ray dated 10/31/2014, 10/30/2014 and 12/29/2012 and chest CT dated 01/04/2012 FINDINGS: There are no pulmonary emboli. There is a 6 x 3 x 3 cm mass just below the carina extending into the left hilum obstructing the bronchus to the remaining portion of the left lung. This mass also compresses the esophagus at the level of the carina. There is almost complete collapse/consolidation of the remaining portion of the left lung. There is either tumor or fluid in the pericardium to the left of midline. Fairly extensive coronary artery disease. Heart size  is normal. Severe emphysema of the right upper lobe. Visualized portion of the upper abdomen demonstrates no acute abnormality. Chronic biliary ductal dilatation. Chronic a parapelvic cyst on the left kidney. Extensive aortic atherosclerosis. Chronic accentuation of the thoracic kyphosis. Review of the MIP images confirms the above findings. 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 Pericardial tumor or fluid adjacent to the mass. Almost complete collapse/ consolidation of the remaining portion of the left lung. Severe emphysema in the right upper  lobe. Electronically Signed   By: Lorriane Shire M.D.   On: 10/31/2014 18:20   Dg Chest Port 1 View  11/02/2014  CLINICAL DATA:  Mucous plugging. EXAM: PORTABLE CHEST 1 VIEW COMPARISON:  10/31/2014. FINDINGS: Mediastinum is normal. Postsurgical changes left lung. Atelectatic changes left lung base again noted With left pleural effusion again noted. No pneumothorax. Cardiomegaly. No pulmonary venous congestion. No pneumothorax. No acute bony abnormality . IMPRESSION: 1. Postsurgical changes left lung. 2. Persistent atelectatic changes left lung base with persistent left pleural effusion. Electronically Signed   By: Marcello Moores  Register   On: 11/02/2014 07:33   Dg Chest Portable 1 View  10/31/2014  CLINICAL DATA:  Left upper abdominal pain, chest pain for 1 week. Worsening today. Shortness of breath. Low O2 sats. History of lung cancer EXAM: PORTABLE CHEST 1 VIEW COMPARISON:  10/30/2014 at [place]: Postoperative changes on the left. Worsening aeration on the left with decreasing lung volumes and increasing airspace opacity, presumably atelectasis with the worsening and volume loss. Right lung is clear. Heart is normal size. Underlying COPD. No visible effusions or acute bony abnormality. IMPRESSION: Worsening aeration on the left with increasing airspace disease and worsening volume loss, presumably related to atelectasis. COPD. Electronically Signed   By: Rolm Baptise M.D.   On: 10/31/2014 16:58    ASSESSMENT / PLAN:   Large Subcarinal Mass - new 6x3x3 cm mass on CT, concerning for malignant recurrence vs mucus plugging.  Initial CXR 10/11 at [facility] was not far off from baseline film post lobectomy.  10/12 imaging now shows significant change with left sided opacity and volume loss raises concern for acute process like mucus plugging.  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  - change mucomysts to 4 ml but bid -pcxr in am  -add flutter -IS -May need bronch earlier than Monday -continued ceftriaxone, consider dc azithro -if spike add vanc -obtain UA, urine na, osm -allow pos balance -may need to dc lovenox if crt clearance less 30 and use sub q hep -treat pain more effectly -updated pt and family  Lavon Paganini. Titus Mould, MD, [place] Pgr: [facility]

## 2014-11-02 NOTE — Progress Notes (Signed)
Patient Demographics  Tamara Key, is a 74 y.o. female, DOB - 01-07-1941, XVQ:[id number]  Admit date - 10/31/2014   Admitting Physician Merton Border, MD  Outpatient Primary MD for the patient is No PCP Per Patient  LOS - 2   Chief Complaint  Patient presents with  . Shortness of Breath  . Chest Pain         Subjective:   Tamara Key today has, No headache, No chest pain, No abdominal pain - No Nausea, still complains of cough, reports worsening dyspnea.   Assessment & Plan    Active Problems:   Pneumonia  Postobstructive pneumonia - Questionable mucous plugging versus left carinal mass, recent chest x-ray prior to admission with no acute findings, so acute process more likely causing worsening of her symptoms. - Continue with pulmonary toilet including chest PT,Mucinex,  N-acetylcysteine repeat chest x-ray in a.m. , may need repeat CT chest, possible need for bronchoscopy as well. - Continue with IV Rocephin and azithromycin for CAP coverage.   acute hypoxic respiratory failure - Most likely related to postobstructive pneumonia - Continue with oxygen as needed,   COPD  - No active wheezing, continue with nebs as needed , DC IV steroids  Hypertension  - Continue with amlodipine   Hyponatremia - Most likely related to  pulmonary proimproving with gentle hydration Code Status: Full   Family Communication: Discussed with son and patient bedside   Disposition Plan:  remains in stepdown  Procedures    Consults   PCCM   Medications  Scheduled Meds: . acetylcysteine  4 mL Nebulization BID  . albuterol  2.5 mg Nebulization TID  . amLODipine  5 mg Oral Daily  . antiseptic oral rinse  7 mL Mouth Rinse BID  . azithromycin  500 mg Intravenous Q24H  . cefTRIAXone (ROCEPHIN)  IV  1 g Intravenous Q24H  . enoxaparin (LOVENOX) injection  30 mg Subcutaneous Q24H  . feeding supplement  (ENSURE ENLIVE)  237 mL Oral TID BM  . guaiFENesin  1,200 mg Oral BID  . sodium chloride  3 mL Intravenous Q12H  . sodium chloride  3 mL Intravenous Q12H   Continuous Infusions: . sodium chloride 75 mL/hr (11/02/14 1211)   PRN Meds:.sodium chloride, acetaminophen **OR** acetaminophen, albuterol, HYDROcodone-acetaminophen, morphine injection, ondansetron **OR** ondansetron (ZOFRAN) IV, sodium chloride, zolpidem  DVT Prophylaxis  Lovenox -   Lab Results  Component Value Date   PLT 359 11/02/2014    Antibiotics    Anti-infectives    Start     Dose/Rate Route Frequency Ordered Stop   11/01/14 2200  azithromycin (ZITHROMAX) 500 mg in dextrose 5 % 250 mL IVPB     500 mg 250 mL/hr over 60 Minutes Intravenous Every 24 hours 11/01/14 1015     11/01/14 1100  cefTRIAXone (ROCEPHIN) 1 g in dextrose 5 % 50 mL IVPB     1 g 100 mL/hr over 30 Minutes Intravenous Every 24 hours 11/01/14 1019     11/01/14 0100  piperacillin-tazobactam (ZOSYN) IVPB 3.375 g  Status:  Discontinued     3.375 g 12.5 mL/hr over 240 Minutes Intravenous 3 times per day 10/31/14 2015 11/01/14 1015   10/31/14 2200  levofloxacin (LEVAQUIN) IVPB 750 mg  Status:  Discontinued     750 mg 100 mL/hr over 90 Minutes Intravenous Every 48 hours 10/31/14 2015 11/01/14 1015   10/31/14 2030  piperacillin-tazobactam (ZOSYN) IVPB 3.375 g  Status:  Discontinued     3.375 g 100 mL/hr over 30 Minutes Intravenous  Once 10/31/14 2015 11/01/14 0023   10/31/14 1915  piperacillin-tazobactam (ZOSYN) IVPB 3.375 g  Status:  Discontinued     3.375 g 100 mL/hr over 30 Minutes Intravenous  Once 10/31/14 1902 10/31/14 2013   10/31/14 1730  cefTRIAXone (ROCEPHIN) 1 g in dextrose 5 % 50 mL IVPB     1 g 100 mL/hr over 30 Minutes Intravenous  Once 10/31/14 1724 10/31/14 2001   10/31/14 1730  azithromycin (ZITHROMAX) 500 mg in dextrose 5 % 250 mL IVPB     500 mg 250 mL/hr over 60 Minutes Intravenous  Once 10/31/14 1724 10/31/14 2121           Objective:   Filed Vitals:   11/02/14 1104 11/02/14 1200 11/02/14 1239 11/02/14 1320  BP:  119/40 119/40   Pulse: 119 120 104   Temp:  97.9 F (36.6 C)    TempSrc:  Axillary    Resp: 25 32 22   Height:      Weight:      SpO2: 88% 91% 92% 93%    Wt Readings from Last 3 Encounters:  10/31/14 48.1 kg (106 lb 0.7 oz)     Intake/Output Summary (Last 24 hours) at 11/02/14 1433 Last data filed at 11/02/14 1400  Gross per 24 hour  Intake 1697.58 ml  Output    530 ml  Net 1167.58 ml     Physical Exam  Awake Alert, Oriented X 3,  Supple Neck,No JVD,  Symmetrical Chest wall movement, decreased air entry on the left, no active wheezing, mild use of accessory muscles  No Gallops,Rubs or new Murmurs, No Parasternal Heave +ve B.Sounds, Abd Soft, No tenderness, No organomegaly appriciated, No rebound - guarding or rigidity. No Cyanosis, Clubbing or edema, No new Rash or bruise     Data Review   Micro Results Recent Results (from the past 240 hour(s))  Urine culture     Status: None   Collection Time: 10/31/14  5:44 PM  Result Value Ref Range Status   Specimen Description URINE, CATHETERIZED  Final   Special Requests NONE  Final   Culture NO GROWTH 1 DAY  Final   Report Status 11/01/2014 FINAL  Final  MRSA PCR Screening     Status: None   Collection Time: 10/31/14 10:20 PM  Result Value Ref Range Status   MRSA by PCR NEGATIVE NEGATIVE Final    Comment:        The GeneXpert MRSA Assay (FDA approved for NASAL specimens only), is one component of a comprehensive MRSA colonization surveillance program. It is not intended to diagnose MRSA infection nor to guide or monitor treatment for MRSA infections.   Culture, blood (routine x 2) Call MD if unable to obtain prior to antibiotics being given     Status: None (Preliminary result)   Collection Time: 10/31/14 10:50 PM  Result Value Ref Range Status   Specimen Description BLOOD RIGHT HAND  Final   Special Requests IN  PEDIATRIC BOTTLE 1CC  Final   Culture NO GROWTH < 24 HOURS  Final   Report Status PENDING  Incomplete  Culture, blood (routine x 2) Call MD if unable to obtain prior to antibiotics being given  Status: None (Preliminary result)   Collection Time: 10/31/14 11:05 PM  Result Value Ref Range Status   Specimen Description BLOOD RIGHT ARM  Final   Special Requests BOTTLES DRAWN AEROBIC ONLY 5CC  Final   Culture NO GROWTH < 24 HOURS  Final   Report Status PENDING  Incomplete    Radiology Reports Ct Angio Chest Pe W/cm &/or Wo Cm  10/31/2014  CLINICAL DATA:  Chest pain and left upper quadrant abdominal pain. Shortness of breath. Low O2 saturation. Nausea. History of lung cancer with left lobectomy. EXAM: CT ANGIOGRAPHY CHEST WITH CONTRAST TECHNIQUE: Multidetector CT imaging of the chest was performed using the standard protocol during bolus administration of intravenous contrast. Multiplanar CT image reconstructions and MIPs were obtained to evaluate the vascular anatomy. CONTRAST:  32m OMNIPAQUE IOHEXOL 350 MG/ML SOLN COMPARISON:  Chest x-ray dated 10/31/2014, 10/30/2014 and 12/29/2012 and chest CT dated 01/04/2012 FINDINGS: There are no pulmonary emboli. There is a 6 x 3 x 3 cm mass just below the carina extending into the left hilum obstructing the bronchus to the remaining portion of the left lung. This mass also compresses the esophagus at the level of the carina. There is almost complete collapse/consolidation of the remaining portion of the left lung. There is either tumor or fluid in the pericardium to the left of midline. Fairly extensive coronary artery disease. Heart size is normal. Severe emphysema of the right upper lobe. Visualized portion of the upper abdomen demonstrates no acute abnormality. Chronic biliary ductal dilatation. Chronic a parapelvic cyst on the left kidney. Extensive aortic atherosclerosis. Chronic accentuation of the thoracic kyphosis. Review of the MIP images confirms the  above findings. 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 Pericardial tumor or fluid adjacent to the mass. Almost complete collapse/ consolidation of the remaining portion of the left lung. Severe emphysema in the right upper lobe. Electronically Signed   By: JLorriane ShireM.D.   On: 10/31/2014 18:20   Dg Chest Port 1 View  11/02/2014  CLINICAL DATA:  Hypoxia EXAM: PORTABLE CHEST 1 VIEW COMPARISON:  Study obtained earlier in the day FINDINGS: Postoperative changes again noted on the left with shift of heart and mediastinum toward the left, stable. There is volume loss the left base with probable small effusion. The right lung is hyperexpanded but clear. Heart size is upper normal with pulmonary vascularity within normal limits given the postoperative change on the left. No adenopathy. There is atherosclerotic calcification on the left. No bone lesions. IMPRESSION: No appreciable change compared to study obtained earlier in the day. Electronically Signed   By: WLowella GripIII M.D.   On: 11/02/2014 13:40   Dg Chest Port 1 View  11/02/2014  CLINICAL DATA:  Mucous plugging. EXAM: PORTABLE CHEST 1 VIEW COMPARISON:  10/31/2014. FINDINGS: Mediastinum is normal. Postsurgical changes left lung. Atelectatic changes left lung base again noted With left pleural effusion again noted. No pneumothorax. Cardiomegaly. No pulmonary venous congestion. No pneumothorax. No acute bony abnormality . IMPRESSION: 1. Postsurgical changes left lung. 2. Persistent atelectatic changes left lung base with persistent left pleural effusion. Electronically Signed   By: TMarcello Moores Register   On: 11/02/2014 07:33   Dg Chest Portable 1 View  10/31/2014  CLINICAL DATA:  Left upper abdominal pain, chest pain for 1 week. Worsening today. Shortness of breath. Low O2 sats. History of lung cancer EXAM: PORTABLE CHEST 1 VIEW COMPARISON[place]  [place] Postoperative changes on the  left. Worsening aeration  on the left with decreasing lung volumes and increasing airspace opacity, presumably atelectasis with the worsening and volume loss. Right lung is clear. Heart is normal size. Underlying COPD. No visible effusions or acute bony abnormality. IMPRESSION: Worsening aeration on the left with increasing airspace disease and worsening volume loss, presumably related to atelectasis. COPD. Electronically Signed   By: Rolm Baptise M.D.   On: 10/31/2014 16:58     CBC  Recent Labs Lab 10/31/14 1630 11/01/14 0545 11/02/14 0243  WBC 11.1* 10.9* 16.4*  HGB 12.6 12.9 12.4  HCT 37.2 37.1 36.2  PLT 388 327 359  MCV 88.6 88.3 88.9  MCH 30.0 30.7 30.5  MCHC 33.9 34.8 34.3  RDW 13.3 13.2 13.4  LYMPHSABS 1.5  --   --   MONOABS 1.0  --   --   EOSABS 0.0  --   --   BASOSABS 0.0  --   --     Chemistries   Recent Labs Lab 10/31/14 1630 10/31/14 1641 11/01/14 0545 11/02/14 0243  NA 130*  --  126* 130*  K 4.0  --  3.5 3.8  CL 92*  --  88* 90*  CO2 22  --  25 27  GLUCOSE 147*  --  168* 82  BUN 16  --  12 39*  CREATININE 0.80 0.70 0.70 1.24*  CALCIUM 10.2  --  8.9 9.5  AST 27  --   --   --   ALT 14  --   --   --   ALKPHOS 96  --   --   --   BILITOT 0.6  --   --   --    ------------------------------------------------------------------------------------------------------------------ estimated creatinine clearance is 27.7 mL/min (by C-G formula based on Cr of 1.24). ------------------------------------------------------------------------------------------------------------------ No results for input(s): HGBA1C in the last 72 hours. ------------------------------------------------------------------------------------------------------------------ No results for input(s): CHOL, HDL, LDLCALC, TRIG, CHOLHDL, LDLDIRECT in the last 72 hours. ------------------------------------------------------------------------------------------------------------------  Recent Labs   10/31/14 2250  TSH 0.847   ------------------------------------------------------------------------------------------------------------------ No results for input(s): VITAMINB12, FOLATE, FERRITIN, TIBC, IRON, RETICCTPCT in the last 72 hours.  Coagulation profile No results for input(s): INR, PROTIME in the last 168 hours.  No results for input(s): DDIMER in the last 72 hours.  Cardiac Enzymes No results for input(s): CKMB, TROPONINI, MYOGLOBIN in the last 168 hours.  Invalid input(s): CK ------------------------------------------------------------------------------------------------------------------ Invalid input(s): POCBNP     Time Spent in minutes   40 minutes   Nikolaus Pienta M.D on 11/02/2014 at 2:33 PM  Between 7am to 7pm - Pager - [phone]  After 7pm go to [email or web address] - password [facility]  [facility]   Office  [phone]

## 2014-11-02 NOTE — Progress Notes (Signed)
[facility] Progress Note Patient Name: Tamara Key DOB: 08-May-1940 MRN: [id number]   Date of Service  11/02/2014  HPI/Events of Note  Increasing dyspnea with increased WOB  eICU Interventions  Start Bipap, check ABG.     Intervention Category Major Interventions: Respiratory failure - evaluation and management  Lotoya Casella 11/02/2014, 6:39 PM

## 2014-11-02 NOTE — Progress Notes (Signed)
Initial Nutrition Assessment  DOCUMENTATION CODES:   Not applicable  INTERVENTION:   -Continue Ensure Enlive po TID, each supplement provides 350 kcal and 20 grams of protein  NUTRITION DIAGNOSIS:   Inadequate oral intake related to poor appetite as evidenced by meal completion < 25%.  GOAL:   Patient will meet greater than or equal to 90% of their needs  MONITOR:   PO intake, Supplement acceptance, Labs, Weight trends, Skin, I & O's  REASON FOR ASSESSMENT:   Malnutrition Screening Tool    ASSESSMENT:   74 y/o F, current smoker, with a PMH of COPD and lung cancer s/p resection and chemotherapy x3 in 2009 (Dr. Arlyce Dice, followed by Dr. Hinton Rao in [place]) who presented to [facility] on 10/12 with L sided chest pain & SOB for one week. Work up concerning for left subcarinal mass with post-obstructive PNA. PCCM consulted.   Pt admitted with carnial mass with left lung collapse. Per MD notes, mass suggestive of acute malignancy recurrence vs mucous plugging.   Pt receiving breathing treatment with RT at time of visit. Pt very frail and ill-appearing. She was coughing and appeared in a lot of distress at time of visit. Nutrition-focused physical exam deferred at this time. RD does suspect malnutrition, however, unable to identify at this time.   Appetite has been poor currently and PTA. Noted meal completion 0%. Pt has been consuming Ensure supplements per MAR. RD will continue order.   Per PCCM notes, plans for bronchoscopy next week.   Labs reviewed: Na: 130 (on IV supplementation).   Diet Order:  Diet Heart Room service appropriate?: Yes; Fluid consistency:: Thin  Skin:  Reviewed, no issues  Last BM:  10/31/14  Height:   Ht Readings from Last 1 Encounters:  10/31/14 5' (1.524 m)    Weight:   Wt Readings from Last 1 Encounters:  10/31/14 106 lb 0.7 oz (48.1 kg)    Ideal Body Weight:  45.5 kg  BMI:  Body mass index is 20.71 kg/(m^2).  Estimated Nutritional Needs:    Kcal:  1450-1650  Protein:  65-80 grams  Fluid:  >1.4 L  EDUCATION NEEDS:   No education needs identified at this time  Geselle Cardosa A. Jimmye Norman, RD, LDN, CDE Pager: [phone] After hours Pager: [phone]

## 2014-11-03 ENCOUNTER — Encounter (HOSPITAL_COMMUNITY): Payer: Self-pay | Admitting: *Deleted

## 2014-11-03 ENCOUNTER — Inpatient Hospital Stay (HOSPITAL_COMMUNITY): Payer: Medicare HMO

## 2014-11-03 DIAGNOSIS — J441 Chronic obstructive pulmonary disease with (acute) exacerbation: Secondary | ICD-10-CM

## 2014-11-03 DIAGNOSIS — J9601 Acute respiratory failure with hypoxia: Secondary | ICD-10-CM | POA: Insufficient documentation

## 2014-11-03 LAB — BASIC METABOLIC PANEL
Anion gap: 7 (ref 5–15)
BUN: 30 mg/dL — AB (ref 6–20)
CO2: 25 mmol/L (ref 22–32)
Calcium: 8.7 mg/dL — ABNORMAL LOW (ref 8.9–10.3)
Chloride: 100 mmol/L — ABNORMAL LOW (ref 101–111)
Creatinine, Ser: 0.67 mg/dL (ref 0.44–1.00)
GFR calc Af Amer: >60 mL/min (ref >60)
GFR calc non Af Amer: >60 mL/min (ref >60)
GLUCOSE: 164 mg/dL — AB (ref 65–99)
POTASSIUM: 3.8 mmol/L (ref 3.5–5.1)
SODIUM: 132 mmol/L — AB (ref 135–145)

## 2014-11-03 LAB — CBC
HCT: 36.7 % (ref 36.0–46.0)
Hemoglobin: 12.4 g/dL (ref 12.0–15.0)
MCH: 30.2 pg (ref 26.0–34.0)
MCHC: 33.8 g/dL (ref 30.0–36.0)
MCV: 89.3 fL (ref 78.0–100.0)
PLATELETS: 364 10*3/uL (ref 150–400)
RBC: 4.11 MIL/uL (ref 3.87–5.11)
RDW: 13.9 % (ref 11.5–15.5)
WBC: 15.1 10*3/uL — AB (ref 4.0–10.5)

## 2014-11-03 MED ORDER — MORPHINE SULFATE (PF) 2 MG/ML IV SOLN: 1.0000 mg | INTRAVENOUS | Status: DC | PRN

## 2014-11-03 MED ORDER — IPRATROPIUM-ALBUTEROL 0.5-2.5 (3) MG/3ML IN SOLN
3.0000 mL | Freq: Four times a day (QID) | RESPIRATORY_TRACT | Status: DC
  Administered 2014-11-03 – 2014-11-08 (×19): 3 mL via RESPIRATORY_TRACT
  Filled 2014-11-03 (×19): qty 3

## 2014-11-03 NOTE — Progress Notes (Signed)
Pt taken off BIPAP and placed on 55% VM. Sats 92%

## 2014-11-03 NOTE — Progress Notes (Addendum)
Patient Demographics  Tamara Key, is a 74 y.o. female, DOB - July 30, 1940, NAT:[id number]  Admit date - 10/31/2014   Admitting Physician Merton Border, MD  Outpatient Primary MD for the patient is No PCP Per Patient  LOS - 3   Chief Complaint  Patient presents with  . Shortness of Breath  . Chest Pain         Subjective:   Tamara Key today has, No headache, No chest pain, No abdominal pain - No Nausea, still complains of cough, reports worsening dyspnea.   Assessment & Plan    Active Problems:   Pneumonia   Hyponatremia   COPD (chronic obstructive pulmonary disease) (HCC)   HTN (hypertension)   Hypoxia   Lung collapse  Postobstructive pneumonia - Questionable mucous plugging versus left carinal mass, recent chest x-ray prior to admission with no acute findings, so acute process more likely causing worsening of her symptoms. - Continue with pulmonary toilet including chest PT,Mucinex,  N-acetylcysteine - Continue with IV Rocephin and azithromycin for CAP coverage. - Repeat chest x-ray with no improvement of aeration of left hemithorax, management per pulmonary,  likely will need bronchoscopy. Did require BiPAP overnight, continues to have increased oxygen demand.   acute hypoxic respiratory failure - Most likely related to postobstructive pneumonia - Titrate O2 as needed, requiring BiPAP as needed as well,   COPD  - No active wheezing, continue with nebs as needed , DC IV steroids  Hypertension  - Continue with amlodipine   Hyponatremia - Most likely related to  pulmonary disease, and dehydration, improving  with gentle hydration  Code Status: Full   Family Communication: None at bedside   Disposition Plan:  remains in stepdown  Procedures    Consults   PCCM   Medications  Scheduled Meds: . acetylcysteine  4 mL Nebulization BID  . albuterol  2.5 mg Nebulization TID   . amLODipine  5 mg Oral Daily  . antiseptic oral rinse  7 mL Mouth Rinse BID  . azithromycin  500 mg Intravenous Q24H  . cefTRIAXone (ROCEPHIN)  IV  1 g Intravenous Q24H  . enoxaparin (LOVENOX) injection  30 mg Subcutaneous Q24H  . feeding supplement (ENSURE ENLIVE)  237 mL Oral TID BM  . guaiFENesin  1,200 mg Oral BID  . sodium chloride  3 mL Intravenous Q12H  . sodium chloride  3 mL Intravenous Q12H   Continuous Infusions: . sodium chloride 75 mL/hr at 11/03/14 0700   PRN Meds:.sodium chloride, acetaminophen **OR** acetaminophen, albuterol, HYDROcodone-acetaminophen, midazolam, morphine injection, ondansetron **OR** ondansetron (ZOFRAN) IV, sodium chloride, zolpidem  DVT Prophylaxis  Lovenox -   Lab Results  Component Value Date   PLT 364 11/03/2014    Antibiotics    Anti-infectives    Start     Dose/Rate Route Frequency Ordered Stop   11/01/14 2200  azithromycin (ZITHROMAX) 500 mg in dextrose 5 % 250 mL IVPB     500 mg 250 mL/hr over 60 Minutes Intravenous Every 24 hours 11/01/14 1015     11/01/14 1100  cefTRIAXone (ROCEPHIN) 1 g in dextrose 5 % 50 mL IVPB     1 g 100 mL/hr over 30 Minutes Intravenous Every 24 hours 11/01/14 1019     11/01/14  0100  piperacillin-tazobactam (ZOSYN) IVPB 3.375 g  Status:  Discontinued     3.375 g 12.5 mL/hr over 240 Minutes Intravenous 3 times per day 10/31/14 2015 11/01/14 1015   10/31/14 2200  levofloxacin (LEVAQUIN) IVPB 750 mg  Status:  Discontinued     750 mg 100 mL/hr over 90 Minutes Intravenous Every 48 hours 10/31/14 2015 11/01/14 1015   10/31/14 2030  piperacillin-tazobactam (ZOSYN) IVPB 3.375 g  Status:  Discontinued     3.375 g 100 mL/hr over 30 Minutes Intravenous  Once 10/31/14 2015 11/01/14 0023   10/31/14 1915  piperacillin-tazobactam (ZOSYN) IVPB 3.375 g  Status:  Discontinued     3.375 g 100 mL/hr over 30 Minutes Intravenous  Once 10/31/14 1902 10/31/14 2013   10/31/14 1730  cefTRIAXone (ROCEPHIN) 1 g in dextrose 5 % 50  mL IVPB     1 g 100 mL/hr over 30 Minutes Intravenous  Once 10/31/14 1724 10/31/14 2001   10/31/14 1730  azithromycin (ZITHROMAX) 500 mg in dextrose 5 % 250 mL IVPB     500 mg 250 mL/hr over 60 Minutes Intravenous  Once 10/31/14 1724 10/31/14 2121          Objective:   Filed Vitals:   11/03/14 0500 11/03/14 0600 11/03/14 0712 11/03/14 0815  BP:   123/46   Pulse: 88 87 105   Temp:   97.7 F (36.5 C)   TempSrc:   Oral   Resp: '19 18 23   '$ Height:      Weight:      SpO2: 94% 100% 97% 95%    Wt Readings from Last 3 Encounters:  10/31/14 48.1 kg (106 lb 0.7 oz)     Intake/Output Summary (Last 24 hours) at 11/03/14 1126 Last data filed at 11/03/14 0945  Gross per 24 hour  Intake 2019.25 ml  Output    475 ml  Net 1544.25 ml     Physical Exam  Awake Alert, Oriented X 3,  Supple Neck,No JVD,  Symmetrical Chest wall movement, decreased air entry on the left, no active wheezing, mild use of accessory muscles  No Gallops,Rubs or new Murmurs, No Parasternal Heave +ve B.Sounds, Abd Soft, No tenderness, No organomegaly appriciated, No rebound - guarding or rigidity. No Cyanosis, Clubbing or edema, No new Rash or bruise     Data Review   Micro Results Recent Results (from the past 240 hour(s))  Urine culture     Status: None   Collection Time: 10/31/14  5:44 PM  Result Value Ref Range Status   Specimen Description URINE, CATHETERIZED  Final   Special Requests NONE  Final   Culture NO GROWTH 1 DAY  Final   Report Status 11/01/2014 FINAL  Final  MRSA PCR Screening     Status: None   Collection Time: 10/31/14 10:20 PM  Result Value Ref Range Status   MRSA by PCR NEGATIVE NEGATIVE Final    Comment:        The GeneXpert MRSA Assay (FDA approved for NASAL specimens only), is one component of a comprehensive MRSA colonization surveillance program. It is not intended to diagnose MRSA infection nor to guide or monitor treatment for MRSA infections.   Culture, blood  (routine x 2) Call MD if unable to obtain prior to antibiotics being given     Status: None (Preliminary result)   Collection Time: 10/31/14 10:50 PM  Result Value Ref Range Status   Specimen Description BLOOD RIGHT HAND  Final   Special Requests  IN PEDIATRIC BOTTLE 1CC  Final   Culture NO GROWTH 2 DAYS  Final   Report Status PENDING  Incomplete  Culture, blood (routine x 2) Call MD if unable to obtain prior to antibiotics being given     Status: None (Preliminary result)   Collection Time: 10/31/14 11:05 PM  Result Value Ref Range Status   Specimen Description BLOOD RIGHT ARM  Final   Special Requests BOTTLES DRAWN AEROBIC ONLY 5CC  Final   Culture NO GROWTH 2 DAYS  Final   Report Status PENDING  Incomplete    Radiology Reports Ct Angio Chest Pe W/cm &/or Wo Cm  10/31/2014  CLINICAL DATA:  Chest pain and left upper quadrant abdominal pain. Shortness of breath. Low O2 saturation. Nausea. History of lung cancer with left lobectomy. EXAM: CT ANGIOGRAPHY CHEST WITH CONTRAST TECHNIQUE: Multidetector CT imaging of the chest was performed using the standard protocol during bolus administration of intravenous contrast. Multiplanar CT image reconstructions and MIPs were obtained to evaluate the vascular anatomy. CONTRAST:  36m OMNIPAQUE IOHEXOL 350 MG/ML SOLN COMPARISON:  Chest x-ray dated 10/31/2014, 10/30/2014 and 12/29/2012 and chest CT dated 01/04/2012 FINDINGS: There are no pulmonary emboli. There is a 6 x 3 x 3 cm mass just below the carina extending into the left hilum obstructing the bronchus to the remaining portion of the left lung. This mass also compresses the esophagus at the level of the carina. There is almost complete collapse/consolidation of the remaining portion of the left lung. There is either tumor or fluid in the pericardium to the left of midline. Fairly extensive coronary artery disease. Heart size is normal. Severe emphysema of the right upper lobe. Visualized portion of the  upper abdomen demonstrates no acute abnormality. Chronic biliary ductal dilatation. Chronic a parapelvic cyst on the left kidney. Extensive aortic atherosclerosis. Chronic accentuation of the thoracic kyphosis. Review of the MIP images confirms the above findings. 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 Pericardial tumor or fluid adjacent to the mass. Almost complete collapse/ consolidation of the remaining portion of the left lung. Severe emphysema in the right upper lobe. Electronically Signed   By: JLorriane ShireM.D.   On: 10/31/2014 18:20   Dg Chest Port 1 View  11/03/2014  CLINICAL DATA:  Lung collapse.  COPD.  Asthma. EXAM: PORTABLE CHEST 1 VIEW COMPARISON:  11/02/2014 FINDINGS: Patient rotated left. Cardiomegaly accentuated by AP portable technique. Volume loss in the left hemi thorax with pleural-parenchymal opacity within the inferior left hemi thorax. No pneumothorax. Right costophrenic angle blunting is likely due to pleural thickening. Right-sided hyperinflation. Diffuse interstitial thickening. Surgical changes about the left hilum. Atherosclerosis in the transverse aorta. IMPRESSION: No significant change since one day prior. Volume loss with presumed postoperative pleural-parenchymal opacity in the inferior left hemi thorax. No pneumothorax or other acute complication. Electronically Signed   By: KAbigail MiyamotoM.D.   On: 11/03/2014 09:25   Dg Chest Port 1 View  11/02/2014  CLINICAL DATA:  Hypoxia EXAM: PORTABLE CHEST 1 VIEW COMPARISON:  Study obtained earlier in the day FINDINGS: Postoperative changes again noted on the left with shift of heart and mediastinum toward the left, stable. There is volume loss the left base with probable small effusion. The right lung is hyperexpanded but clear. Heart size is upper normal with pulmonary vascularity within normal limits given the postoperative change on the left. No adenopathy. There is atherosclerotic calcification  on the left. No bone lesions. IMPRESSION: No appreciable change compared  to study obtained earlier in the day. Electronically Signed   By: Lowella Grip III M.D.   On: 11/02/2014 13:40   Dg Chest Port 1 View  11/02/2014  CLINICAL DATA:  Mucous plugging. EXAM: PORTABLE CHEST 1 VIEW COMPARISON:  10/31/2014. FINDINGS: Mediastinum is normal. Postsurgical changes left lung. Atelectatic changes left lung base again noted With left pleural effusion again noted. No pneumothorax. Cardiomegaly. No pulmonary venous congestion. No pneumothorax. No acute bony abnormality . IMPRESSION: 1. Postsurgical changes left lung. 2. Persistent atelectatic changes left lung base with persistent left pleural effusion. Electronically Signed   By: Marcello Moores  Register   On: 11/02/2014 07:33   Dg Chest Portable 1 View  10/31/2014  CLINICAL DATA:  Left upper abdominal pain, chest pain for 1 week. Worsening today. Shortness of breath. Low O2 sats. History of lung cancer EXAM: PORTABLE CHEST 1 VIEW COMPARISON:  10/30/2014 at [place]: Postoperative changes on the left. Worsening aeration on the left with decreasing lung volumes and increasing airspace opacity, presumably atelectasis with the worsening and volume loss. Right lung is clear. Heart is normal size. Underlying COPD. No visible effusions or acute bony abnormality. IMPRESSION: Worsening aeration on the left with increasing airspace disease and worsening volume loss, presumably related to atelectasis. COPD. Electronically Signed   By: Rolm Baptise M.D.   On: 10/31/2014 16:58     CBC  Recent Labs Lab 10/31/14 1630 11/01/14 0545 11/02/14 0243 11/03/14 0850  WBC 11.1* 10.9* 16.4* 15.1*  HGB 12.6 12.9 12.4 12.4  HCT 37.2 37.1 36.2 36.7  PLT 388 327 359 364  MCV 88.6 88.3 88.9 89.3  MCH 30.0 30.7 30.5 30.2  MCHC 33.9 34.8 34.3 33.8  RDW 13.3 13.2 13.4 13.9  LYMPHSABS 1.5  --   --   --   MONOABS 1.0  --   --   --   EOSABS 0.0  --   --   --    BASOSABS 0.0  --   --   --     Chemistries   Recent Labs Lab 10/31/14 1630 10/31/14 1641 11/01/14 0545 11/02/14 0243 11/03/14 0850  NA 130*  --  126* 130* 132*  K 4.0  --  3.5 3.8 3.8  CL 92*  --  88* 90* 100*  CO2 22  --  '25 27 25  '$ GLUCOSE 147*  --  168* 82 164*  BUN 16  --  12 39* 30*  CREATININE 0.80 0.70 0.70 1.24* 0.67  CALCIUM 10.2  --  8.9 9.5 8.7*  AST 27  --   --   --   --   ALT 14  --   --   --   --   ALKPHOS 96  --   --   --   --   BILITOT 0.6  --   --   --   --    ------------------------------------------------------------------------------------------------------------------ estimated creatinine clearance is 43 mL/min (by C-G formula based on Cr of 0.67). ------------------------------------------------------------------------------------------------------------------ No results for input(s): HGBA1C in the last 72 hours. ------------------------------------------------------------------------------------------------------------------ No results for input(s): CHOL, HDL, LDLCALC, TRIG, CHOLHDL, LDLDIRECT in the last 72 hours. ------------------------------------------------------------------------------------------------------------------  Recent Labs  10/31/14 2250  TSH 0.847   ------------------------------------------------------------------------------------------------------------------ No results for input(s): VITAMINB12, FOLATE, FERRITIN, TIBC, IRON, RETICCTPCT in the last 72 hours.  Coagulation profile No results for input(s): INR, PROTIME in the last 168 hours.  No results for input(s): DDIMER in the last 72 hours.  Cardiac Enzymes No results for input(s):  CKMB, TROPONINI, MYOGLOBIN in the last 168 hours.  Invalid input(s): CK ------------------------------------------------------------------------------------------------------------------ Invalid input(s): POCBNP     Time Spent in minutes   40 minutes   Latonia Conrow M.D on  11/03/2014 at 11:26 AM  Between 7am to 7pm - Pager - [phone]  After 7pm go to [email or web address] - password [facility]  [facility]   Office  [phone]

## 2014-11-03 NOTE — Progress Notes (Signed)
Name: Tamara Key MRN: [id number] DOB: 08/19/1940    ADMISSION DATE:  10/31/2014 CONSULTATION DATE:  11/01/14  REFERRING MD :  Dr. Waldron Labs  CHIEF COMPLAINT:  Carinal Mass/Post-Obstructive PNA  BRIEF PATIENT DESCRIPTION: 74 y/o F, current smoker, with a PMH of COPD and lung cancer s/p resection and chemotherapy x3 in 2009 (Dr. Arlyce Dice, followed by Dr. Hinton Rao in [place]) who presented to [facility] on 10/12 with L sided chest pain & SOB for one week.  Work up concerning for left subcarinal mass with post-obstructive PNA.  PCCM consulted.   SIGNIFICANT EVENTS  04/2007  Last seen by Dr. Arlyce Dice for follow up CXR after resection and chemotherapy  10/12  Admit to [facility] with 1 week hx of chest pain & SOB.  Work up concerning for L subcarinal mass and post obstructive PNA  STUDIES:  10/12  CTA Chest >> no evidence of PE, large subcarinal mass (6x3x3 cm) obstructing the left mainstem bronchus, pericardial tumor or fluid adjacent to the mass, almost complete collapse/consolidation of the remaining portion of the lung, severe emphysema in RUL gies and polydipsia.  SUBJECTIVE:   C/o severe L back/shoulder pain.  More SOB overnight requiring bipap, now on VM.  Minimal cough. Denies chest pain.   VITAL SIGNS: Temp:  [97.1 F (36.2 C)-98.1 F (36.7 C)] 97.7 F (36.5 C) (10/15 0712) Pulse Rate:  [75-120] 105 (10/15 0712) Resp:  [14-32] 23 (10/15 0712) BP: (108-129)/(39-56) 123/46 mmHg (10/15 0712) SpO2:  [84 %-100 %] 95 % (10/15 0815) FiO2 (%):  [35 %-70 %] 55 % (10/15 0815)  PHYSICAL EXAMINATION: General:  Thin elderly female  Uncomfortable appearing in bed Neuro:  Awake, alert, oriented, appropriate, gen weakness  HEENT:  MM pink/moist, no jvd, venti mask  Cardiovascular:  s1s2 rrr, no m/r/g, distant tones  Lungs: resps even, very mildly labored on venti mask, NO egoph, coarse and slight reduced left Abdomen:  Flat, soft, bsx4 active  Musculoskeletal:  No acute deformities  Skin:  Warm/dry, no  edema    Recent Labs Lab 10/31/14 1630 10/31/14 1641 11/01/14 0545 11/02/14 0243  NA 130*  --  126* 130*  K 4.0  --  3.5 3.8  CL 92*  --  88* 90*  CO2 22  --  25 27  BUN 16  --  12 39*  CREATININE 0.80 0.70 0.70 1.24*  GLUCOSE 147*  --  168* 82    Recent Labs Lab 10/31/14 1630 11/01/14 0545 11/02/14 0243  HGB 12.6 12.9 12.4  HCT 37.2 37.1 36.2  WBC 11.1* 10.9* 16.4*  PLT 388 327 359   Dg Chest Port 1 View  11/02/2014  CLINICAL DATA:  Hypoxia EXAM: PORTABLE CHEST 1 VIEW COMPARISON:  Study obtained earlier in the day FINDINGS: Postoperative changes again noted on the left with shift of heart and mediastinum toward the left, stable. There is volume loss the left base with probable small effusion. The right lung is hyperexpanded but clear. Heart size is upper normal with pulmonary vascularity within normal limits given the postoperative change on the left. No adenopathy. There is atherosclerotic calcification on the left. No bone lesions. IMPRESSION: No appreciable change compared to study obtained earlier in the day. Electronically Signed   By: Lowella Grip III M.D.   On: 11/02/2014 13:40   Dg Chest Port 1 View  11/02/2014  CLINICAL DATA:  Mucous plugging. EXAM: PORTABLE CHEST 1 VIEW COMPARISON:  10/31/2014. FINDINGS: Mediastinum is normal. Postsurgical changes left lung. Atelectatic changes left  lung base again noted With left pleural effusion again noted. No pneumothorax. Cardiomegaly. No pulmonary venous congestion. No pneumothorax. No acute bony abnormality . IMPRESSION: 1. Postsurgical changes left lung. 2. Persistent atelectatic changes left lung base with persistent left pleural effusion. 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  Initial CXR 10/11 at [facility] was not far off from baseline film post  lobectomy.  10/12 imaging now shows significant change with left sided opacity and volume loss raises concern for acute process like mucus plugging +/- post obstructive process.  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  -continue mucomysts BID -pcxr in am  -max pulmonary hygiene as tolerated -- continue attempts at chest vest -IS, flutter  -continue PRN bipap  -continued ceftriaxone, consider dc azithro -allow pos balance -pain control - change morphine to q3h for more effective control to allow pulm hygiene  -updated pt and family -plan FOB   Given slightly worsening respiratory status she may ultimately need bronch earlier than Monday -- but in current respiratory state would likely need intubation for FOB.     Nickolas Madrid, NP 11/03/2014  9:03 AM Pager: [phone] or [phone]

## 2014-11-04 ENCOUNTER — Inpatient Hospital Stay (HOSPITAL_COMMUNITY): Payer: Medicare HMO

## 2014-11-04 NOTE — Progress Notes (Signed)
Name: Tamara Key MRN: [id number] DOB: Feb 19, 1940    ADMISSION DATE:  10/31/2014 CONSULTATION DATE:  11/01/14  REFERRING MD :  Dr. Waldron Labs  CHIEF COMPLAINT:  Carinal Mass/Post-Obstructive PNA  BRIEF PATIENT DESCRIPTION: 74 y/o F, current smoker, with a PMH of COPD and lung cancer s/p resection and chemotherapy x3 in 2009 (Dr. Arlyce Dice, followed by Dr. Hinton Rao in [place]) who presented to [facility] on 10/12 with L sided chest pain & SOB for one week.  Work up concerning for left subcarinal mass with post-obstructive PNA.  PCCM consulted.   SIGNIFICANT EVENTS  04/2007  Last seen by Dr. Arlyce Dice for follow up CXR after resection and chemotherapy  10/12  Admit to [facility] with 1 week hx of chest pain & SOB.  Work up concerning for L subcarinal mass and post obstructive PNA  STUDIES:  10/12  CTA Chest >> no evidence of PE, large subcarinal mass (6x3x3 cm) obstructing the left mainstem bronchus, pericardial tumor or fluid adjacent to the mass, almost complete collapse/consolidation of the remaining portion of the lung, severe emphysema in RUL gies and polydipsia.  SUBJECTIVE:   Much improved.  Denies SOB, chest pain is better.  Visiting with family, working on flutter.  No bipap required overnight.   VITAL SIGNS: Temp:  [97.4 F (36.3 C)-98.5 F (36.9 C)] 97.8 F (36.6 C) (10/16 0725) Pulse Rate:  [81-110] 95 (10/16 0800) Resp:  [13-25] 17 (10/16 0800) BP: (98-145)/(34-68) 105/56 mmHg (10/16 0950) SpO2:  [95 %-100 %] 95 % (10/16 0800) FiO2 (%):  [40 %-55 %] 55 % (10/16 0400)  PHYSICAL EXAMINATION: General:  Thin elderly female  NAD Neuro:  Awake, alert, oriented, appropriate, gen weakness  HEENT:  MM pink/moist, no jvd, venti mask  Cardiovascular:  s1s2 rrr, no m/r/g, distant tones  Lungs: resps even, non labored on [place], working on flutter valve, diminished L, few scattered rhonchi  Abdomen:  Flat, soft, bsx4 active  Musculoskeletal:  No acute deformities  Skin:  Warm/dry, no edema     Recent Labs Lab 11/01/14 0545 11/02/14 0243 11/03/14 0850  NA 126* 130* 132*  K 3.5 3.8 3.8  CL 88* 90* 100*  CO2 '25 27 25  '$ BUN 12 39* 30*  CREATININE 0.70 1.24* 0.67  GLUCOSE 168* 82 164*    Recent Labs Lab 11/01/14 0545 11/02/14 0243 11/03/14 0850  HGB 12.9 12.4 12.4  HCT 37.1 36.2 36.7  WBC 10.9* 16.4* 15.1*  PLT 327 359 364   Dg Chest Portable 1 View  11/04/2014  CLINICAL DATA:  pneumonia.  COPD.  Hypertension. EXAM: PORTABLE CHEST 1 VIEW COMPARISON:  One day prior FINDINGS: Tracheal deviation to the left. Moderate cardiomegaly. Surgical clips about the left hilum. Small to moderate left pleural effusion. No pneumothorax. The right lung remains clear. Pleural-parenchymal opacity in the inferior left hemi thorax is increased. IMPRESSION: Worsened left-sided aeration. Suspicious for infection, superimposed upon postoperative atelectasis. Increase in small to moderate left pleural effusion. Electronically Signed   By: Abigail Miyamoto M.D.   On: 11/04/2014 08:31   Dg Chest Port 1 View  11/03/2014  CLINICAL DATA:  Lung collapse.  COPD.  Asthma. EXAM: PORTABLE CHEST 1 VIEW COMPARISON:  11/02/2014 FINDINGS: Patient rotated left. Cardiomegaly accentuated by AP portable technique. Volume loss in the left hemi thorax with pleural-parenchymal opacity within the inferior left hemi thorax. No pneumothorax. Right costophrenic angle blunting is likely due to pleural thickening. Right-sided hyperinflation. Diffuse interstitial thickening. Surgical changes about the left hilum. Atherosclerosis in  the transverse aorta. IMPRESSION: No significant change since one day prior. Volume loss with presumed postoperative pleural-parenchymal opacity in the inferior left hemi thorax. No pneumothorax or other acute complication. Electronically Signed   By: Abigail Miyamoto M.D.   On: 11/03/2014 09:25   Dg Chest Port 1 View  11/02/2014  CLINICAL DATA:  Hypoxia EXAM: PORTABLE CHEST 1 VIEW COMPARISON:  Study  obtained earlier in the day FINDINGS: Postoperative changes again noted on the left with shift of heart and mediastinum toward the left, stable. There is volume loss the left base with probable small effusion. The right lung is hyperexpanded but clear. Heart size is upper normal with pulmonary vascularity within normal limits given the postoperative change on the left. No adenopathy. There is atherosclerotic calcification on the left. No bone lesions. IMPRESSION: No appreciable change compared to study obtained earlier in the day. Electronically Signed   By: Lowella Grip III M.D.   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  Initial CXR 10/11 at [facility] was not far off from baseline film post lobectomy.  10/12 imaging now shows significant change with left sided opacity and volume loss raises concern for acute process like mucus plugging +/- post obstructive process.  Hx of Non-Small Cell Lung Cancer s/p Resection (Stage II-A in 2009) - s/p LUL lobectomy & three rounds of chemotherapy COPD  Post-Obstructive PNA  Tobacco Abuse - ongoing smoking up to admission  Delirium vs Dementia -  Not clear on her baseline mental status Weight Loss / Concern for FTT AKI  Plan: -CXR worse but clinically much improved  -continue mucomyst BID through 10/17 -max pulmonary hygiene as tolerated -- continue attempts at chest vest -IS, flutter  -continue PRN bipap  -continued ceftriaxone, consider dc azithro -allow pos balance -pain control - change morphine to q3h for more effective control to allow pulm hygiene  -f/u PCXR  -consider repeat CT chest 10/17 -will need FOB - ?10/17 if respiratory status continues to improve.  -pt says she "doubts" she would want intubation/mech vent but daughter unsure - continue goals of care discussions.    Nickolas Madrid, NP 11/04/2014  10:40 AM Pager:  [phone] or [phone]

## 2014-11-04 NOTE — Evaluation (Signed)
Physical Therapy Evaluation Patient Details Name: Tamara Key MRN: [id number]  DOB: 02/01/1940 Today's Date: 11/04/2014   History of Present Illness  74 y/o F, current smoker, with a PMH of COPD and lung cancer s/p resection and chemotherapy x3 in 2009 (Dr. Arlyce Dice, followed by Dr. Hinton Rao in [place]) who presented to [facility] on 10/12 with L sided chest pain & SOB for one week. Work up concerning for left subcarinal mass with post-obstructive PNA  Clinical Impression  Patient demonstrates deficits in functional mobility as indicated below. Will need continued skilled PT to address deficits and maximize function. Will see as indicated and progress as tolerated.   OF NOTE: Entered room as patient was attempting to climb OOB. Patient poor historian, unable to obtain clear picture of PLOF or assist provided PTA. Patient with increased anxiety during mobility, assist to [facility] and back to bed with nsg present. Elevation in HR upper 120s and desaturation in oxygen levels 86% on 6 liters with initial mobility, improved to 97% at rest. Nsg in room and aware.     Follow Up Recommendations SNF;Supervision/Assistance - 24 hour VS Home with Family and 24/7 assist     Equipment Recommendations   (tbd)    Recommendations for Other Services       Precautions / Restrictions Precautions Precautions: Fall Restrictions Weight Bearing Restrictions: No      Mobility  Bed Mobility Overal bed mobility: Needs Assistance Bed Mobility: Supine to Sit;Sit to Supine     Supine to sit: Min guard Sit to supine: Min guard   General bed mobility comments: min guard for safety, patient was able to get to EOB by herself but had no awareness of lines  Transfers Overall transfer level: Needs assistance Equipment used: 1 person hand held assist Transfers: Sit to/from [facility] Sit to Stand: Min assist Stand pivot transfers: Min assist       General transfer comment: Min assist for stability,  patient with increased effort to perform tasks VCs for hand placement and safety  Ambulation/Gait Ambulation/Gait assistance: Min assist;+2 safety/equipment Ambulation Distance (Feet): 6 Feet (to [facility]) Assistive device: 1 person hand held assist Gait Pattern/deviations: Step-to pattern     General Gait Details: patient able to take several steps to [facility] with hand held assist, increase effort and DOE noted, O2 saturations dropped to 87% with activity on 6 liters, HR elevated to upper 120s, patient appears significantly fatigued   Stairs            Wheelchair Mobility    Modified Rankin (Stroke Patients Only)       Balance                                             Pertinent Vitals/Pain Pain Assessment: Faces Faces Pain Scale: Hurts little more Pain Location: chest/ throat  Pain Descriptors / Indicators: Discomfort Pain Intervention(s): Monitored during session;Limited activity within patient's tolerance    Home Living Family/patient expects to be discharged to:: Skilled nursing facility Living Arrangements: Alone                    Prior Function                 Hand Dominance   Dominant Hand: Right    Extremity/Trunk Assessment   Upper Extremity Assessment: Generalized weakness  Lower Extremity Assessment: Generalized weakness      Cervical / Trunk Assessment: Kyphotic  Communication      Cognition Arousal/Alertness: Awake/alert Behavior During Therapy: Restless;Anxious;Impulsive Overall Cognitive Status: No family/caregiver present to determine baseline cognitive functioning                      General Comments      Exercises        Assessment/Plan    PT Assessment Patient needs continued PT services  PT Diagnosis Difficulty walking;Generalized weakness   PT Problem List Decreased strength;Decreased activity tolerance;Decreased balance;Decreased mobility;Decreased cognition;Decreased  safety awareness;Cardiopulmonary status limiting activity  PT Treatment Interventions DME instruction;Gait training;Functional mobility training;Therapeutic activities;Therapeutic exercise;Balance training;Patient/family education   PT Goals (Current goals can be found in the Care Plan section) Acute Rehab PT Goals Patient Stated Goal: to be able to breathe PT Goal Formulation: With patient Time For Goal Achievement: 11/18/14 Potential to Achieve Goals: Fair    Frequency Min 3X/week   Barriers to discharge        Co-evaluation               End of Session Equipment Utilized During Treatment: Oxygen (6 liters [place]) Activity Tolerance: Patient limited by fatigue Patient left: in bed;with call bell/phone within reach;with bed alarm set;with nursing/sitter in room Nurse Communication: Mobility status         Time: 1117-3567 PT Time Calculation (min) (ACUTE ONLY): 14 min   Charges:   PT Evaluation $Initial PT Evaluation Tier I: 1 Procedure     PT G CodesDuncan Dull 2014-11-19, 3:51 PM Alben Deeds, [place] DPT  [phone]

## 2014-11-04 NOTE — Progress Notes (Addendum)
Patient Demographics  Tamara Key, is a 74 y.o. female, DOB - 30-Jan-1940, MHD:[id number]  Admit date - 10/31/2014   Admitting Physician Merton Border, MD  Outpatient Primary MD for the patient is No PCP Per Patient  LOS - 4   Chief Complaint  Patient presents with  . Shortness of Breath  . Chest Pain         Subjective:   Tamara Key today has, No headache, No chest pain, No abdominal pain - No Nausea, still complains of cough, reports her breathing feels better today.  Assessment & Plan    Active Problems:   Pneumonia   Hyponatremia   COPD (chronic obstructive pulmonary disease) (HCC)   HTN (hypertension)   Hypoxia   Lung collapse   Acute respiratory failure with hypoxia (HCC)  Postobstructive pneumonia - Questionable mucous plugging versus left carinal mass, recent chest x-ray prior to admission with no acute findings, so acute process more likely causing worsening of her symptoms. - Continue with pulmonary toilet including chest PT,Mucinex,  N-acetylcysteine, initially could not tolerate vest, will reattempt today as she feels better. - Continue with IV Rocephin and azithromycin for CAP coverage 10/12 - Repeat chest x-ray today with worsening lung aeration almost complete opacification of left lung, but patient clinically looks better., management per pulmonary,  likely will need bronchoscopy if respiratory status continues to improve. - On BiPAP when necessary - Improving oxygen demand today, tolerating 4 L   acute hypoxic respiratory failure - Most likely related to postobstructive pneumonia - Titrate O2 as needed, requiring BiPAP as needed as well,   COPD  - No active wheezing, continue with nebs as needed , off IV steroids  Hypertension  - Continue with amlodipine   Hyponatremia - Most likely related to  pulmonary disease, and dehydration, improving  with gentle  hydration  Protein calorie malnutrition - Continue ensure  Code Status: Full   Family Communication: None at bedside   Disposition Plan:  remains in stepdown  Procedures    Consults   PCCM   Medications  Scheduled Meds: . acetylcysteine  4 mL Nebulization BID  . amLODipine  5 mg Oral Daily  . antiseptic oral rinse  7 mL Mouth Rinse BID  . azithromycin  500 mg Intravenous Q24H  . cefTRIAXone (ROCEPHIN)  IV  1 g Intravenous Q24H  . enoxaparin (LOVENOX) injection  30 mg Subcutaneous Q24H  . feeding supplement (ENSURE ENLIVE)  237 mL Oral TID BM  . guaiFENesin  1,200 mg Oral BID  . ipratropium-albuterol  3 mL Nebulization QID  . sodium chloride  3 mL Intravenous Q12H  . sodium chloride  3 mL Intravenous Q12H   Continuous Infusions: . sodium chloride 75 mL/hr at 11/04/14 0949   PRN Meds:.sodium chloride, acetaminophen **OR** acetaminophen, albuterol, HYDROcodone-acetaminophen, midazolam, morphine injection, ondansetron **OR** ondansetron (ZOFRAN) IV, sodium chloride, zolpidem  DVT Prophylaxis  Lovenox -   Lab Results  Component Value Date   PLT 364 11/03/2014    Antibiotics    Anti-infectives    Start     Dose/Rate Route Frequency Ordered Stop   11/01/14 2200  azithromycin (ZITHROMAX) 500 mg in dextrose 5 % 250 mL IVPB     500 mg 250 mL/hr over 60  Minutes Intravenous Every 24 hours 11/01/14 1015     11/01/14 1100  cefTRIAXone (ROCEPHIN) 1 g in dextrose 5 % 50 mL IVPB     1 g 100 mL/hr over 30 Minutes Intravenous Every 24 hours 11/01/14 1019     11/01/14 0100  piperacillin-tazobactam (ZOSYN) IVPB 3.375 g  Status:  Discontinued     3.375 g 12.5 mL/hr over 240 Minutes Intravenous 3 times per day 10/31/14 2015 11/01/14 1015   10/31/14 2200  levofloxacin (LEVAQUIN) IVPB 750 mg  Status:  Discontinued     750 mg 100 mL/hr over 90 Minutes Intravenous Every 48 hours 10/31/14 2015 11/01/14 1015   10/31/14 2030  piperacillin-tazobactam (ZOSYN) IVPB 3.375 g  Status:   Discontinued     3.375 g 100 mL/hr over 30 Minutes Intravenous  Once 10/31/14 2015 11/01/14 0023   10/31/14 1915  piperacillin-tazobactam (ZOSYN) IVPB 3.375 g  Status:  Discontinued     3.375 g 100 mL/hr over 30 Minutes Intravenous  Once 10/31/14 1902 10/31/14 2013   10/31/14 1730  cefTRIAXone (ROCEPHIN) 1 g in dextrose 5 % 50 mL IVPB     1 g 100 mL/hr over 30 Minutes Intravenous  Once 10/31/14 1724 10/31/14 2001   10/31/14 1730  azithromycin (ZITHROMAX) 500 mg in dextrose 5 % 250 mL IVPB     500 mg 250 mL/hr over 60 Minutes Intravenous  Once 10/31/14 1724 10/31/14 2121          Objective:   Filed Vitals:   11/04/14 0725 11/04/14 0800 11/04/14 0950 11/04/14 1110  BP: 109/35  105/56   Pulse: 95 95    Temp: 97.8 F (36.6 C)     TempSrc: Oral     Resp: 17 17    Height:      Weight:      SpO2: 95% 95%  98%    Wt Readings from Last 3 Encounters:  10/31/14 48.1 kg (106 lb 0.7 oz)     Intake/Output Summary (Last 24 hours) at 11/04/14 1127 Last data filed at 11/04/14 0900  Gross per 24 hour  Intake   2465 ml  Output    900 ml  Net   1565 ml     Physical Exam  Awake Alert, Oriented X 3,  Supple Neck,No JVD,  Symmetrical Chest wall movement, decreased air entry on the left, no active wheezing, appears to be less in respiratory distress today  No Gallops,Rubs or new Murmurs, No Parasternal Heave +ve B.Sounds, Abd Soft, No tenderness, No organomegaly appriciated, No rebound - guarding or rigidity. No Cyanosis, Clubbing or edema, No new Rash or bruise     Data Review   Micro Results Recent Results (from the past 240 hour(s))  Urine culture     Status: None   Collection Time: 10/31/14  5:44 PM  Result Value Ref Range Status   Specimen Description URINE, CATHETERIZED  Final   Special Requests NONE  Final   Culture NO GROWTH 1 DAY  Final   Report Status 11/01/2014 FINAL  Final  MRSA PCR Screening     Status: None   Collection Time: 10/31/14 10:20 PM  Result Value  Ref Range Status   MRSA by PCR NEGATIVE NEGATIVE Final    Comment:        The GeneXpert MRSA Assay (FDA approved for NASAL specimens only), is one component of a comprehensive MRSA colonization surveillance program. It is not intended to diagnose MRSA infection nor to guide or monitor treatment for  MRSA infections.   Culture, blood (routine x 2) Call MD if unable to obtain prior to antibiotics being given     Status: None (Preliminary result)   Collection Time: 10/31/14 10:50 PM  Result Value Ref Range Status   Specimen Description BLOOD RIGHT HAND  Final   Special Requests IN PEDIATRIC BOTTLE 1CC  Final   Culture NO GROWTH 3 DAYS  Final   Report Status PENDING  Incomplete  Culture, blood (routine x 2) Call MD if unable to obtain prior to antibiotics being given     Status: None (Preliminary result)   Collection Time: 10/31/14 11:05 PM  Result Value Ref Range Status   Specimen Description BLOOD RIGHT ARM  Final   Special Requests BOTTLES DRAWN AEROBIC ONLY 5CC  Final   Culture NO GROWTH 3 DAYS  Final   Report Status PENDING  Incomplete    Radiology Reports Ct Angio Chest Pe W/cm &/or Wo Cm  10/31/2014  CLINICAL DATA:  Chest pain and left upper quadrant abdominal pain. Shortness of breath. Low O2 saturation. Nausea. History of lung cancer with left lobectomy. EXAM: CT ANGIOGRAPHY CHEST WITH CONTRAST TECHNIQUE: Multidetector CT imaging of the chest was performed using the standard protocol during bolus administration of intravenous contrast. Multiplanar CT image reconstructions and MIPs were obtained to evaluate the vascular anatomy. CONTRAST:  80m OMNIPAQUE IOHEXOL 350 MG/ML SOLN COMPARISON:  Chest x-ray dated 10/31/2014, 10/30/2014 and 12/29/2012 and chest CT dated 01/04/2012 FINDINGS: There are no pulmonary emboli. There is a 6 x 3 x 3 cm mass just below the carina extending into the left hilum obstructing the bronchus to the remaining portion of the left lung. This mass also  compresses the esophagus at the level of the carina. There is almost complete collapse/consolidation of the remaining portion of the left lung. There is either tumor or fluid in the pericardium to the left of midline. Fairly extensive coronary artery disease. Heart size is normal. Severe emphysema of the right upper lobe. Visualized portion of the upper abdomen demonstrates no acute abnormality. Chronic biliary ductal dilatation. Chronic a parapelvic cyst on the left kidney. Extensive aortic atherosclerosis. Chronic accentuation of the thoracic kyphosis. Review of the MIP images confirms the above findings. 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 Pericardial tumor or fluid adjacent to the mass. Almost complete collapse/ consolidation of the remaining portion of the left lung. Severe emphysema in the right upper lobe. Electronically Signed   By: JLorriane ShireM.D.   On: 10/31/2014 18:20   Dg Chest Portable 1 View  11/04/2014  CLINICAL DATA:  pneumonia.  COPD.  Hypertension. EXAM: PORTABLE CHEST 1 VIEW COMPARISON:  One day prior FINDINGS: Tracheal deviation to the left. Moderate cardiomegaly. Surgical clips about the left hilum. Small to moderate left pleural effusion. No pneumothorax. The right lung remains clear. Pleural-parenchymal opacity in the inferior left hemi thorax is increased. IMPRESSION: Worsened left-sided aeration. Suspicious for infection, superimposed upon postoperative atelectasis. Increase in small to moderate left pleural effusion. Electronically Signed   By: KAbigail MiyamotoM.D.   On: 11/04/2014 08:31   Dg Chest Port 1 View  11/03/2014  CLINICAL DATA:  Lung collapse.  COPD.  Asthma. EXAM: PORTABLE CHEST 1 VIEW COMPARISON:  11/02/2014 FINDINGS: Patient rotated left. Cardiomegaly accentuated by AP portable technique. Volume loss in the left hemi thorax with pleural-parenchymal opacity within the inferior left hemi thorax. No pneumothorax. Right costophrenic  angle blunting is likely due to pleural thickening. Right-sided hyperinflation. Diffuse  interstitial thickening. Surgical changes about the left hilum. Atherosclerosis in the transverse aorta. IMPRESSION: No significant change since one day prior. Volume loss with presumed postoperative pleural-parenchymal opacity in the inferior left hemi thorax. No pneumothorax or other acute complication. Electronically Signed   By: Abigail Miyamoto M.D.   On: 11/03/2014 09:25   Dg Chest Port 1 View  11/02/2014  CLINICAL DATA:  Hypoxia EXAM: PORTABLE CHEST 1 VIEW COMPARISON:  Study obtained earlier in the day FINDINGS: Postoperative changes again noted on the left with shift of heart and mediastinum toward the left, stable. There is volume loss the left base with probable small effusion. The right lung is hyperexpanded but clear. Heart size is upper normal with pulmonary vascularity within normal limits given the postoperative change on the left. No adenopathy. There is atherosclerotic calcification on the left. No bone lesions. IMPRESSION: No appreciable change compared to study obtained earlier in the day. Electronically Signed   By: Lowella Grip III M.D.   On: 11/02/2014 13:40   Dg Chest Port 1 View  11/02/2014  CLINICAL DATA:  Mucous plugging. EXAM: PORTABLE CHEST 1 VIEW COMPARISON:  10/31/2014. FINDINGS: Mediastinum is normal. Postsurgical changes left lung. Atelectatic changes left lung base again noted With left pleural effusion again noted. No pneumothorax. Cardiomegaly. No pulmonary venous congestion. No pneumothorax. No acute bony abnormality . IMPRESSION: 1. Postsurgical changes left lung. 2. Persistent atelectatic changes left lung base with persistent left pleural effusion. Electronically Signed   By: Marcello Moores  Register   On: 11/02/2014 07:33   Dg Chest Portable 1 View  10/31/2014  CLINICAL DATA:  Left upper abdominal pain, chest pain for 1 week. Worsening today. Shortness of breath. Low O2 sats. History  of lung cancer EXAM: PORTABLE CHEST 1 VIEW COMPARISON:  10/30/2014 at [place]: Postoperative changes on the left. Worsening aeration on the left with decreasing lung volumes and increasing airspace opacity, presumably atelectasis with the worsening and volume loss. Right lung is clear. Heart is normal size. Underlying COPD. No visible effusions or acute bony abnormality. IMPRESSION: Worsening aeration on the left with increasing airspace disease and worsening volume loss, presumably related to atelectasis. COPD. Electronically Signed   By: Rolm Baptise M.D.   On: 10/31/2014 16:58     CBC  Recent Labs Lab 10/31/14 1630 11/01/14 0545 11/02/14 0243 11/03/14 0850  WBC 11.1* 10.9* 16.4* 15.1*  HGB 12.6 12.9 12.4 12.4  HCT 37.2 37.1 36.2 36.7  PLT 388 327 359 364  MCV 88.6 88.3 88.9 89.3  MCH 30.0 30.7 30.5 30.2  MCHC 33.9 34.8 34.3 33.8  RDW 13.3 13.2 13.4 13.9  LYMPHSABS 1.5  --   --   --   MONOABS 1.0  --   --   --   EOSABS 0.0  --   --   --   BASOSABS 0.0  --   --   --     Chemistries   Recent Labs Lab 10/31/14 1630 10/31/14 1641 11/01/14 0545 11/02/14 0243 11/03/14 0850  NA 130*  --  126* 130* 132*  K 4.0  --  3.5 3.8 3.8  CL 92*  --  88* 90* 100*  CO2 22  --  '25 27 25  '$ GLUCOSE 147*  --  168* 82 164*  BUN 16  --  12 39* 30*  CREATININE 0.80 0.70 0.70 1.24* 0.67  CALCIUM 10.2  --  8.9 9.5 8.7*  AST 27  --   --   --   --  ALT 14  --   --   --   --   ALKPHOS 96  --   --   --   --   BILITOT 0.6  --   --   --   --    ------------------------------------------------------------------------------------------------------------------ estimated creatinine clearance is 43 mL/min (by C-G formula based on Cr of 0.67). ------------------------------------------------------------------------------------------------------------------ No results for input(s): HGBA1C in the last 72  hours. ------------------------------------------------------------------------------------------------------------------ No results for input(s): CHOL, HDL, LDLCALC, TRIG, CHOLHDL, LDLDIRECT in the last 72 hours. ------------------------------------------------------------------------------------------------------------------ No results for input(s): TSH, T4TOTAL, T3FREE, THYROIDAB in the last 72 hours.  Invalid input(s): FREET3 ------------------------------------------------------------------------------------------------------------------ No results for input(s): VITAMINB12, FOLATE, FERRITIN, TIBC, IRON, RETICCTPCT in the last 72 hours.  Coagulation profile No results for input(s): INR, PROTIME in the last 168 hours.  No results for input(s): DDIMER in the last 72 hours.  Cardiac Enzymes No results for input(s): CKMB, TROPONINI, MYOGLOBIN in the last 168 hours.  Invalid input(s): CK ------------------------------------------------------------------------------------------------------------------ Invalid input(s): POCBNP     Time Spent in minutes   40 minutes   Kassaundra Hair M.D on 11/04/2014 at 11:27 AM  Between 7am to 7pm - Pager - [phone]  After 7pm go to [email or web address] - password [facility]  [facility]   Office  [phone]

## 2014-11-05 ENCOUNTER — Inpatient Hospital Stay (HOSPITAL_COMMUNITY): Payer: Medicare HMO

## 2014-11-05 LAB — CBC
HEMATOCRIT: 35.1 % — AB (ref 36.0–46.0)
HEMOGLOBIN: 11.7 g/dL — AB (ref 12.0–15.0)
MCH: 30.1 pg (ref 26.0–34.0)
MCHC: 33.3 g/dL (ref 30.0–36.0)
MCV: 90.2 fL (ref 78.0–100.0)
Platelets: 323 10*3/uL (ref 150–400)
RBC: 3.89 MIL/uL (ref 3.87–5.11)
RDW: 14.1 % (ref 11.5–15.5)
WBC: 8.7 10*3/uL (ref 4.0–10.5)

## 2014-11-05 LAB — BASIC METABOLIC PANEL
Anion gap: 9 (ref 5–15)
BUN: 24 mg/dL — AB (ref 6–20)
CHLORIDE: 96 mmol/L — AB (ref 101–111)
CO2: 27 mmol/L (ref 22–32)
Calcium: 8.4 mg/dL — ABNORMAL LOW (ref 8.9–10.3)
Creatinine, Ser: 0.5 mg/dL (ref 0.44–1.00)
GFR calc Af Amer: >60 mL/min (ref >60)
GFR calc non Af Amer: >60 mL/min (ref >60)
GLUCOSE: 87 mg/dL (ref 65–99)
POTASSIUM: 4.4 mmol/L (ref 3.5–5.1)
Sodium: 132 mmol/L — ABNORMAL LOW (ref 135–145)

## 2014-11-05 LAB — CULTURE, BLOOD (ROUTINE X 2)
CULTURE: NO GROWTH
Culture: NO GROWTH

## 2014-11-05 MED ORDER — AZITHROMYCIN 500 MG PO TABS
500.0000 mg | ORAL_TABLET | Freq: Every day | ORAL | Status: DC
Start: 2014-11-05 — End: 2014-11-06
  Administered 2014-11-05: 500 mg via ORAL
  Filled 2014-11-05 (×2): qty 1

## 2014-11-05 MED ORDER — SODIUM CHLORIDE 0.9 % IV SOLN
INTRAVENOUS | Status: DC
  Administered 2014-11-05 – 2014-11-06 (×2): via INTRAVENOUS

## 2014-11-05 NOTE — Care Management Important Message (Signed)
Important Message  Patient Details  Name: Tamara Key MRN: [id number] Date of Birth: 18-Jun-1940   Medicare Important Message Given:  Yes-third notification given    [name] 11/05/2014, 1:12 PM

## 2014-11-05 NOTE — Progress Notes (Signed)
Physical Therapy Treatment Patient Details Name: Tamara Key MRN: [id number] DOB: 1940/04/02 Today's Date: 11/05/2014    History of Present Illness 74 y/o F, current smoker, with a PMH of COPD and lung cancer s/p resection and chemotherapy x3 in 2009 (Dr. Arlyce Dice, followed by Dr. Hinton Rao in [place]) who presented to [facility] on 10/12 with L sided chest pain & SOB for one week. Work up concerning for left subcarinal mass with post-obstructive PNA    PT Comments    Pt admitted with above diagnosis. Pt currently with functional limitations due to balance and endurance deficits. Pt continues to be limited by poor endurance.  Will continue to progress as able.   Pt will benefit from skilled PT to increase their independence and safety with mobility to allow discharge to the venue listed below.    Follow Up Recommendations  SNF;Supervision/Assistance - 24 hour (vs home with family)     Equipment Recommendations  Other (comment) (TBA)    Recommendations for Other Services       Precautions / Restrictions Precautions Precautions: Fall Restrictions Weight Bearing Restrictions: No    Mobility  Bed Mobility Overal bed mobility: Needs Assistance Bed Mobility: Supine to Sit     Supine to sit: Min guard     General bed mobility comments: min guard for safety, patient was able to get to EOB by herself but had no awareness of lines  Transfers Overall transfer level: Needs assistance Equipment used: Rolling walker (2 wheeled) Transfers: Sit to/from Stand Sit to Stand: Min assist         General transfer comment: Min assist for stability, patient with increased effort to perform tasks VCs for hand placement and safety  Ambulation/Gait Ambulation/Gait assistance: Min assist;+2 safety/equipment Ambulation Distance (Feet): 6 Feet Assistive device: Rolling walker (2 wheeled) Gait Pattern/deviations: Step-to pattern;Decreased stride length;Trunk flexed;Wide base of support;Staggering  left;Staggering right;Drifts right/left;Leaning posteriorly   Gait velocity interpretation: Below normal speed for age/gender General Gait Details: patient able to take several steps from bed to chair with RW, increase effort and DOE noted, O2 saturations dropped to 79% with activity on  5 liters but quickly returned to >90% after 1 min rest in chair.   patient appears significantly fatigued    Stairs            Wheelchair Mobility    Modified Rankin (Stroke Patients Only)       Balance Overall balance assessment: Needs assistance;History of Falls         Standing balance support: Bilateral upper extremity supported;During functional activity Standing balance-Leahy Scale: Poor Standing balance comment: requires use of RW.                    Cognition Arousal/Alertness: Awake/alert Behavior During Therapy: Restless;Anxious;Impulsive Overall Cognitive Status: Impaired/Different from baseline Area of Impairment: Awareness;Problem solving           Awareness: Intellectual Problem Solving: Difficulty sequencing;Requires verbal cues      Exercises      General Comments        Pertinent Vitals/Pain Pain Assessment: No/denies pain  VSS with exception of 5LO2 with desat with activity.      Home Living                      Prior Function            PT Goals (current goals can now be found in the care plan section) Progress towards PT goals: Progressing  toward goals    Frequency  Min 3X/week    PT Plan Current plan remains appropriate    Co-evaluation             End of Session Equipment Utilized During Treatment: Gait belt;Oxygen (5LO2) Activity Tolerance: Patient limited by fatigue Patient left: in chair;with call bell/phone within reach;with chair alarm set;with family/visitor present     Time: 2094-7096 PT Time Calculation (min) (ACUTE ONLY): 19 min  Charges:  $Therapeutic Activity: 8-22 mins                    G  Codes:      Alijah Hyde, Godfrey Pick November 17, 2014, 1:07 PM [facility] [facility] [phone] (pager)

## 2014-11-05 NOTE — Progress Notes (Signed)
Patient Demographics  Tamara Key, is a 74 y.o. female, DOB - 01-Feb-1940, PTW:[id number]  Admit date - 10/31/2014   Admitting Physician Merton Border, MD  Outpatient Primary MD for the patient is No PCP Per Patient  LOS - 5   Chief Complaint  Patient presents with  . Shortness of Breath  . Chest Pain         Subjective:   [name] today has, No headache, No chest pain, No abdominal pain - No Nausea, still complains of cough, reports her breathing tenderness to improve.  Assessment & Plan    Active Problems:   Pneumonia   Hyponatremia   COPD (chronic obstructive pulmonary disease) (HCC)   HTN (hypertension)   Hypoxia   Lung collapse   Acute respiratory failure with hypoxia (HCC)  Postobstructive pneumonia - Questionable mucous plugging versus left carinal mass, recent chest x-ray prior to admission with no acute findings, so acute process more likely causing worsening of her symptoms. - Continue with pulmonary toilet including chest PT,Mucinex,  N-acetylcysteine, initially could not tolerate vest, will reattempt today as she feels better. - Continue with IV Rocephin  for CAP coverage 10/12, will discontinue IV azithromycin has finished 5 days on it. - Repeat chest x-ray today with worsening lung aeration with complete opacification of left lung, but her respiratory status appears to be improving, management per pulmonary,  likely will need bronchoscopy if respiratory status continues to improve, family and patient tolerated pulmonary no in a.m. their decision about level of care they wish for. - On BiPAP when necessary, no further requirement of BiPAP over the last 24 hours - Improving oxygen demand today, tolerating 4 L   acute hypoxic respiratory failure - Most likely related to postobstructive pneumonia - Titrate O2 as tolerated, requiring BiPAP as needed as well,   COPD  - No  active wheezing, continue with nebs as needed , off IV steroids  Hypertension  - Continue with amlodipine   Hyponatremia - Most likely related to  pulmonary disease, and dehydration, improving  with gentle hydration  Protein calorie malnutrition - Continue ensure  Code Status: Full   Family Communication: Family members at bedside  Disposition Plan:  remains in stepdown  Procedures    Consults   PCCM   Medications  Scheduled Meds: . acetylcysteine  4 mL Nebulization BID  . amLODipine  5 mg Oral Daily  . antiseptic oral rinse  7 mL Mouth Rinse BID  . azithromycin  500 mg Intravenous Q24H  . cefTRIAXone (ROCEPHIN)  IV  1 g Intravenous Q24H  . enoxaparin (LOVENOX) injection  30 mg Subcutaneous Q24H  . feeding supplement (ENSURE ENLIVE)  237 mL Oral TID BM  . guaiFENesin  1,200 mg Oral BID  . ipratropium-albuterol  3 mL Nebulization QID  . sodium chloride  3 mL Intravenous Q12H  . sodium chloride  3 mL Intravenous Q12H   Continuous Infusions:   PRN Meds:.sodium chloride, acetaminophen **OR** acetaminophen, albuterol, HYDROcodone-acetaminophen, midazolam, morphine injection, ondansetron **OR** ondansetron (ZOFRAN) IV, sodium chloride, zolpidem  DVT Prophylaxis  Lovenox -   Lab Results  Component Value Date   PLT 323 11/05/2014    Antibiotics    Anti-infectives    Start  Dose/Rate Route Frequency Ordered Stop   11/01/14 2200  azithromycin (ZITHROMAX) 500 mg in dextrose 5 % 250 mL IVPB     500 mg 250 mL/hr over 60 Minutes Intravenous Every 24 hours 11/01/14 1015     11/01/14 1100  cefTRIAXone (ROCEPHIN) 1 g in dextrose 5 % 50 mL IVPB     1 g 100 mL/hr over 30 Minutes Intravenous Every 24 hours 11/01/14 1019     11/01/14 0100  piperacillin-tazobactam (ZOSYN) IVPB 3.375 g  Status:  Discontinued     3.375 g 12.5 mL/hr over 240 Minutes Intravenous 3 times per day 10/31/14 2015 11/01/14 1015   10/31/14 2200  levofloxacin (LEVAQUIN) IVPB 750 mg  Status:   Discontinued     750 mg 100 mL/hr over 90 Minutes Intravenous Every 48 hours 10/31/14 2015 11/01/14 1015   10/31/14 2030  piperacillin-tazobactam (ZOSYN) IVPB 3.375 g  Status:  Discontinued     3.375 g 100 mL/hr over 30 Minutes Intravenous  Once 10/31/14 2015 11/01/14 0023   10/31/14 1915  piperacillin-tazobactam (ZOSYN) IVPB 3.375 g  Status:  Discontinued     3.375 g 100 mL/hr over 30 Minutes Intravenous  Once 10/31/14 1902 10/31/14 2013   10/31/14 1730  cefTRIAXone (ROCEPHIN) 1 g in dextrose 5 % 50 mL IVPB     1 g 100 mL/hr over 30 Minutes Intravenous  Once 10/31/14 1724 10/31/14 2001   10/31/14 1730  azithromycin (ZITHROMAX) 500 mg in dextrose 5 % 250 mL IVPB     500 mg 250 mL/hr over 60 Minutes Intravenous  Once 10/31/14 1724 10/31/14 2121          Objective:   Filed Vitals:   11/05/14 0859 11/05/14 1125 11/05/14 1147 11/05/14 1154  BP:  106/48    Pulse:  93    Temp:  97.6 F (36.4 C)    TempSrc:  Oral    Resp:  18    Height:      Weight:      SpO2: 98% 95% 93% 98%    Wt Readings from Last 3 Encounters:  10/31/14 48.1 kg (106 lb 0.7 oz)     Intake/Output Summary (Last 24 hours) at 11/05/14 1319 Last data filed at 11/05/14 1141  Gross per 24 hour  Intake    660 ml  Output    450 ml  Net    210 ml     Physical Exam  Awake Alert, Oriented X 3,  Supple Neck,No JVD,  Symmetrical Chest wall movement, significantly decreased air entry on the left, no active wheezing, appears to be less in respiratory distress today  No Gallops,Rubs or new Murmurs, No Parasternal Heave +ve B.Sounds, Abd Soft, No tenderness, No organomegaly appriciated, No rebound - guarding or rigidity. No Cyanosis, Clubbing or edema, No new Rash or bruise     Data Review   Micro Results Recent Results (from the past 240 hour(s))  Urine culture     Status: None   Collection Time: 10/31/14  5:44 PM  Result Value Ref Range Status   Specimen Description URINE, CATHETERIZED  Final   Special  Requests NONE  Final   Culture NO GROWTH 1 DAY  Final   Report Status 11/01/2014 FINAL  Final  MRSA PCR Screening     Status: None   Collection Time: 10/31/14 10:20 PM  Result Value Ref Range Status   MRSA by PCR NEGATIVE NEGATIVE Final    Comment:        The  GeneXpert MRSA Assay (FDA approved for NASAL specimens only), is one component of a comprehensive MRSA colonization surveillance program. It is not intended to diagnose MRSA infection nor to guide or monitor treatment for MRSA infections.   Culture, blood (routine x 2) Call MD if unable to obtain prior to antibiotics being given     Status: None (Preliminary result)   Collection Time: 10/31/14 10:50 PM  Result Value Ref Range Status   Specimen Description BLOOD RIGHT HAND  Final   Special Requests IN PEDIATRIC BOTTLE 1CC  Final   Culture NO GROWTH 4 DAYS  Final   Report Status PENDING  Incomplete  Culture, blood (routine x 2) Call MD if unable to obtain prior to antibiotics being given     Status: None (Preliminary result)   Collection Time: 10/31/14 11:05 PM  Result Value Ref Range Status   Specimen Description BLOOD RIGHT ARM  Final   Special Requests BOTTLES DRAWN AEROBIC ONLY 5CC  Final   Culture NO GROWTH 4 DAYS  Final   Report Status PENDING  Incomplete    Radiology Reports Ct Angio Chest Pe W/cm &/or Wo Cm  10/31/2014  CLINICAL DATA:  Chest pain and left upper quadrant abdominal pain. Shortness of breath. Low O2 saturation. Nausea. History of lung cancer with left lobectomy. EXAM: CT ANGIOGRAPHY CHEST WITH CONTRAST TECHNIQUE: Multidetector CT imaging of the chest was performed using the standard protocol during bolus administration of intravenous contrast. Multiplanar CT image reconstructions and MIPs were obtained to evaluate the vascular anatomy. CONTRAST:  74m OMNIPAQUE IOHEXOL 350 MG/ML SOLN COMPARISON:  Chest x-ray dated 10/31/2014, 10/30/2014 and 12/29/2012 and chest CT dated 01/04/2012 FINDINGS: There are no  pulmonary emboli. There is a 6 x 3 x 3 cm mass just below the carina extending into the left hilum obstructing the bronchus to the remaining portion of the left lung. This mass also compresses the esophagus at the level of the carina. There is almost complete collapse/consolidation of the remaining portion of the left lung. There is either tumor or fluid in the pericardium to the left of midline. Fairly extensive coronary artery disease. Heart size is normal. Severe emphysema of the right upper lobe. Visualized portion of the upper abdomen demonstrates no acute abnormality. Chronic biliary ductal dilatation. Chronic a parapelvic cyst on the left kidney. Extensive aortic atherosclerosis. Chronic accentuation of the thoracic kyphosis. Review of the MIP images confirms the above findings. 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 Pericardial tumor or fluid adjacent to the mass. Almost complete collapse/ consolidation of the remaining portion of the left lung. Severe emphysema in the right upper lobe. Electronically Signed   By: JLorriane ShireM.D.   On: 10/31/2014 18:20   Dg Chest Port 1 View  11/05/2014  CLINICAL DATA:  Subsequent encounter for shortness of breath EXAM: PORTABLE CHEST 1 VIEW COMPARISON:  11/04/2014 FINDINGS: 0713 hours. Continued further opacification of the left hemi thorax with apparent associated increase and left hemithoracic volume loss. Imaging features are suspicious for worsening collapse/ consolidation of the residual left lung. Right lung is hyperexpanded without focal airspace consolidation. Interstitial markings are diffusely coarsened with chronic features. Bones are diffusely demineralized. Telemetry leads overlie the chest. IMPRESSION: Worsening collapse/consolidation in the residual left lung. Electronically Signed   By: EMisty StanleyM.D.   On: 11/05/2014 07:49   Dg Chest Portable 1 View  11/04/2014  CLINICAL DATA:  pneumonia.  COPD.   Hypertension. EXAM: PORTABLE CHEST 1 VIEW COMPARISON:  One day prior FINDINGS: Tracheal deviation to the left. Moderate cardiomegaly. Surgical clips about the left hilum. Small to moderate left pleural effusion. No pneumothorax. The right lung remains clear. Pleural-parenchymal opacity in the inferior left hemi thorax is increased. IMPRESSION: Worsened left-sided aeration. Suspicious for infection, superimposed upon postoperative atelectasis. Increase in small to moderate left pleural effusion. Electronically Signed   By: Abigail Miyamoto M.D.   On: 11/04/2014 08:31   Dg Chest Port 1 View  11/03/2014  CLINICAL DATA:  Lung collapse.  COPD.  Asthma. EXAM: PORTABLE CHEST 1 VIEW COMPARISON:  11/02/2014 FINDINGS: Patient rotated left. Cardiomegaly accentuated by AP portable technique. Volume loss in the left hemi thorax with pleural-parenchymal opacity within the inferior left hemi thorax. No pneumothorax. Right costophrenic angle blunting is likely due to pleural thickening. Right-sided hyperinflation. Diffuse interstitial thickening. Surgical changes about the left hilum. Atherosclerosis in the transverse aorta. IMPRESSION: No significant change since one day prior. Volume loss with presumed postoperative pleural-parenchymal opacity in the inferior left hemi thorax. No pneumothorax or other acute complication. Electronically Signed   By: Abigail Miyamoto M.D.   On: 11/03/2014 09:25   Dg Chest Port 1 View  11/02/2014  CLINICAL DATA:  Hypoxia EXAM: PORTABLE CHEST 1 VIEW COMPARISON:  Study obtained earlier in the day FINDINGS: Postoperative changes again noted on the left with shift of heart and mediastinum toward the left, stable. There is volume loss the left base with probable small effusion. The right lung is hyperexpanded but clear. Heart size is upper normal with pulmonary vascularity within normal limits given the postoperative change on the left. No adenopathy. There is atherosclerotic calcification on the left.  No bone lesions. IMPRESSION: No appreciable change compared to study obtained earlier in the day. Electronically Signed   By: Lowella Grip III M.D.   On: 11/02/2014 13:40   Dg Chest Port 1 View  11/02/2014  CLINICAL DATA:  Mucous plugging. EXAM: PORTABLE CHEST 1 VIEW COMPARISON:  10/31/2014. FINDINGS: Mediastinum is normal. Postsurgical changes left lung. Atelectatic changes left lung base again noted With left pleural effusion again noted. No pneumothorax. Cardiomegaly. No pulmonary venous congestion. No pneumothorax. No acute bony abnormality . IMPRESSION: 1. Postsurgical changes left lung. 2. Persistent atelectatic changes left lung base with persistent left pleural effusion. Electronically Signed   By: Marcello Moores  Register   On: 11/02/2014 07:33   Dg Chest Portable 1 View  10/31/2014  CLINICAL DATA:  Left upper abdominal pain, chest pain for 1 week. Worsening today. Shortness of breath. Low O2 sats. History of lung cancer EXAM: PORTABLE CHEST 1 VIEW COMPARISON:  10/30/2014 at [facility]: Postoperative changes on the left. Worsening aeration on the left with decreasing lung volumes and increasing airspace opacity, presumably atelectasis with the worsening and volume loss. Right lung is clear. Heart is normal size. Underlying COPD. No visible effusions or acute bony abnormality. IMPRESSION: Worsening aeration on the left with increasing airspace disease and worsening volume loss, presumably related to atelectasis. COPD. Electronically Signed   By: Rolm Baptise M.D.   On: 10/31/2014 16:58     CBC  Recent Labs Lab 10/31/14 1630 11/01/14 0545 11/02/14 0243 11/03/14 0850 11/05/14 0250  WBC 11.1* 10.9* 16.4* 15.1* 8.7  HGB 12.6 12.9 12.4 12.4 11.7*  HCT 37.2 37.1 36.2 36.7 35.1*  PLT 388 327 359 364 323  MCV 88.6 88.3 88.9 89.3 90.2  MCH 30.0 30.7 30.5 30.2 30.1  MCHC 33.9 34.8 34.3 33.8 33.3  RDW 13.3 13.2 13.4  13.9 14.1  LYMPHSABS 1.5  --   --   --   --   MONOABS 1.0  --    --   --   --   EOSABS 0.0  --   --   --   --   BASOSABS 0.0  --   --   --   --     Chemistries   Recent Labs Lab 10/31/14 1630 10/31/14 1641 11/01/14 0545 11/02/14 0243 11/03/14 0850 11/05/14 0250  NA 130*  --  126* 130* 132* 132*  K 4.0  --  3.5 3.8 3.8 4.4  CL 92*  --  88* 90* 100* 96*  CO2 22  --  '25 27 25 27  '$ GLUCOSE 147*  --  168* 82 164* 87  BUN 16  --  12 39* 30* 24*  CREATININE 0.80 0.70 0.70 1.24* 0.67 0.50  CALCIUM 10.2  --  8.9 9.5 8.7* 8.4*  AST 27  --   --   --   --   --   ALT 14  --   --   --   --   --   ALKPHOS 96  --   --   --   --   --   BILITOT 0.6  --   --   --   --   --    ------------------------------------------------------------------------------------------------------------------ estimated creatinine clearance is 43 mL/min (by C-G formula based on Cr of 0.5). ------------------------------------------------------------------------------------------------------------------ No results for input(s): HGBA1C in the last 72 hours. ------------------------------------------------------------------------------------------------------------------ No results for input(s): CHOL, HDL, LDLCALC, TRIG, CHOLHDL, LDLDIRECT in the last 72 hours. ------------------------------------------------------------------------------------------------------------------ No results for input(s): TSH, T4TOTAL, T3FREE, THYROIDAB in the last 72 hours.  Invalid input(s): FREET3 ------------------------------------------------------------------------------------------------------------------ No results for input(s): VITAMINB12, FOLATE, FERRITIN, TIBC, IRON, RETICCTPCT in the last 72 hours.  Coagulation profile No results for input(s): INR, PROTIME in the last 168 hours.  No results for input(s): DDIMER in the last 72 hours.  Cardiac Enzymes No results for input(s): CKMB, TROPONINI, MYOGLOBIN in the last 168 hours.  Invalid input(s):  CK ------------------------------------------------------------------------------------------------------------------ Invalid input(s): POCBNP     Time Spent in minutes   40 minutes   Avyaan Summer M.D on 11/05/2014 at 1:19 PM  Between 7am to 7pm - Pager - [phone]  After 7pm go to [email or web address] - password [facility]  [facility]   Office  [phone]

## 2014-11-05 NOTE — Progress Notes (Signed)
Met w pt and 2 very good friends. Pt lives in indep apartment in [place] She gets meals on wheels 3x/wk. phy there has rec hhc vs snf dep on help at home. Pt has da but da will not be staying w her. If she goes home she will be alone. Discussed poss snf for rehab and pt just not sure what dc plans will be. She will speak w da and she how she does w phy there.

## 2014-11-05 NOTE — Progress Notes (Signed)
Name: Tamara Key MRN: [id number] DOB: 1940/11/04    ADMISSION DATE:  10/31/2014 CONSULTATION DATE:  11/01/14  REFERRING MD :  Dr. Waldron Labs  CHIEF COMPLAINT:  Carinal Mass/Post-Obstructive PNA  BRIEF PATIENT DESCRIPTION: 74 y/o F, current smoker, with a PMH of COPD and lung cancer s/p resection and chemotherapy x3 in 2009 (Dr. Arlyce Dice, followed by Dr. Hinton Rao in [place]) who presented to [facility] on 10/12 with L sided chest pain & SOB for one week.  Work up concerning for left subcarinal mass with post-obstructive PNA.  PCCM consulted.   SIGNIFICANT EVENTS  04/2007  Last seen by Dr. Arlyce Dice for follow up CXR after resection and chemotherapy  10/12  Admit to [facility] with 1 week hx of chest pain & SOB.  Work up concerning for L subcarinal mass and post obstructive PNA  STUDIES:  10/12  CTA Chest >> no evidence of PE, large subcarinal mass (6x3x3 cm) obstructing the left mainstem bronchus, pericardial tumor or fluid adjacent to the mass, almost complete collapse/consolidation of the remaining portion of the lung, severe emphysema in RUL gies and polydipsia.  SUBJECTIVE:   Much improved.  Denies SOB, chest pain is better.  Visiting with family, working on flutter.  No bipap required overnight.   VITAL SIGNS: Temp:  [97.3 F (36.3 C)-98.7 F (37.1 C)] 97.6 F (36.4 C) (10/17 1125) Pulse Rate:  [93-113] 93 (10/17 1125) Resp:  [16-35] 18 (10/17 1125) BP: (106-140)/(35-81) 106/48 mmHg (10/17 1125) SpO2:  [88 %-100 %] 98 % (10/17 1154)  PHYSICAL EXAMINATION: General:  Thin elderly female  NAD Neuro:  Awake, alert, oriented, appropriate, gen weakness  Head: [place]/AT. EENT:  MM pink/moist, no jvd, on [place] Cardiovascular:  s1s2 rrr, no m/r/g, distant tones  Lungs: resps even, non labored on [place], working on flutter valve, diminished L, few scattered rhonchi  Abdomen:  Flat, soft, bsx4 active  Musculoskeletal:  No acute deformities  Skin:  Warm/dry, no edema    Recent Labs Lab 11/02/14 0243  11/03/14 0850 11/05/14 0250  NA 130* 132* 132*  K 3.8 3.8 4.4  CL 90* 100* 96*  CO2 '27 25 27  '$ BUN 39* 30* 24*  CREATININE 1.24* 0.67 0.50  GLUCOSE 82 164* 87    Recent Labs Lab 11/02/14 0243 11/03/14 0850 11/05/14 0250  HGB 12.4 12.4 11.7*  HCT 36.2 36.7 35.1*  WBC 16.4* 15.1* 8.7  PLT 359 364 323   Dg Chest Port 1 View  11/05/2014  CLINICAL DATA:  Subsequent encounter for shortness of breath EXAM: PORTABLE CHEST 1 VIEW COMPARISON:  11/04/2014 FINDINGS: 0713 hours. Continued further opacification of the left hemi thorax with apparent associated increase and left hemithoracic volume loss. Imaging features are suspicious for worsening collapse/ consolidation of the residual left lung. Right lung is hyperexpanded without focal airspace consolidation. Interstitial markings are diffusely coarsened with chronic features. Bones are diffusely demineralized. Telemetry leads overlie the chest. IMPRESSION: Worsening collapse/consolidation in the residual left lung. Electronically Signed   By: Misty Stanley M.D.   On: 11/05/2014 07:49   Dg Chest Portable 1 View  11/04/2014  CLINICAL DATA:  pneumonia.  COPD.  Hypertension. EXAM: PORTABLE CHEST 1 VIEW COMPARISON:  One day prior FINDINGS: Tracheal deviation to the left. Moderate cardiomegaly. Surgical clips about the left hilum. Small to moderate left pleural effusion. No pneumothorax. The right lung remains clear. Pleural-parenchymal opacity in the inferior left hemi thorax is increased. IMPRESSION: Worsened left-sided aeration. Suspicious for infection, superimposed upon postoperative atelectasis. Increase in  small to moderate left pleural effusion. Electronically Signed   By: Abigail Miyamoto M.D.   On: 11/04/2014 08:31   I reviewed chest CT myself, subcarinal node noted.  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  Initial CXR 10/11 at [facility] was not far  off from baseline film post lobectomy.  10/12 imaging now shows significant change with left sided opacity and volume loss raises concern for acute process like mucus plugging +/- post obstructive process.  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 - Will eventually need bronch.  COPD  - Bronchodilators - Hold steroids for now.  Post-Obstructive PNA  - Rocephin. - D/C zithromax.  Tobacco Abuse - ongoing smoking up to admission  - Smoking cessation.  Delirium vs Dementia -  Not clear on her baseline mental status  Weight Loss / Concern for FTT  AKI  Plan: - CXR worse but clinically much improved  - Continue mucomyst BID through 10/17 - Max pulmonary hygiene as tolerated -- continue attempts at chest vest - IS, flutter  - Continue PRN bipap  - Continued ceftriaxone, consider dc azithro - Pain control - change morphine to q3h for more effective control to allow pulm hygiene  - F/u PCXR  - Consider repeat CT chest when persistently off BiPAP. - Will need FOB - ?10/17 if respiratory status continues to improve.  - Pt says she "doubts" she would want intubation/mech vent but daughter unsure - continue goals of care discussions.   Spoke with patient and family extensively.  Explored the idea of bronchoscopy, patient not sure if she desires treatment.  Also discussed code status.  They will be discussing this and come back to [place] with a family meeting again in AM.  Discussed with bedside RN and family.  Rush Farmer, M.D. [facility] Pulmonary/Critical Care Medicine. Pager: [phone]. After hours pager: [phone].

## 2014-11-06 ENCOUNTER — Inpatient Hospital Stay (HOSPITAL_COMMUNITY): Payer: Medicare HMO

## 2014-11-06 LAB — BASIC METABOLIC PANEL
ANION GAP: 10 (ref 5–15)
BUN: 22 mg/dL — ABNORMAL HIGH (ref 6–20)
CHLORIDE: 98 mmol/L — AB (ref 101–111)
CO2: 28 mmol/L (ref 22–32)
CREATININE: 0.61 mg/dL (ref 0.44–1.00)
Calcium: 8.9 mg/dL (ref 8.9–10.3)
GFR calc non Af Amer: >60 mL/min (ref >60)
Glucose, Bld: 118 mg/dL — ABNORMAL HIGH (ref 65–99)
POTASSIUM: 4.7 mmol/L (ref 3.5–5.1)
Sodium: 136 mmol/L (ref 135–145)

## 2014-11-06 LAB — CBC
HEMATOCRIT: 34 % — AB (ref 36.0–46.0)
Hemoglobin: 11.3 g/dL — ABNORMAL LOW (ref 12.0–15.0)
MCH: 29.8 pg (ref 26.0–34.0)
MCHC: 33.2 g/dL (ref 30.0–36.0)
MCV: 89.7 fL (ref 78.0–100.0)
Platelets: 343 10*3/uL (ref 150–400)
RBC: 3.79 MIL/uL — ABNORMAL LOW (ref 3.87–5.11)
RDW: 14.1 % (ref 11.5–15.5)
WBC: 8.4 10*3/uL (ref 4.0–10.5)

## 2014-11-06 NOTE — Progress Notes (Addendum)
Spoke w pt again reg dc planning. Pt states not sure now if going to her apt in [place] or her da carrie [facility]ine's home. Unable to reach carrie by phone. Dr Nelda Marseille states will need home o2 and home hospice. Left hospice agency list in room. Will cont to try and reach da to determine dc plan.

## 2014-11-06 NOTE — Progress Notes (Signed)
Patient Demographics  Tamara Key, is a 74 y.o. female, DOB - 1941/01/02, TZG:[id number]  Admit date - 10/31/2014   Admitting Physician Merton Border, MD  Outpatient Primary MD for the patient is No PCP Per Patient  LOS - 6   Chief Complaint  Patient presents with  . Shortness of Breath  . Chest Pain         Subjective:   [name] today has, No headache, No chest pain, No abdominal pain - No Nausea, still complains of cough, and shortness of breath . Assessment & Plan    Active Problems:   Pneumonia   Hyponatremia   COPD (chronic obstructive pulmonary disease) (HCC)   HTN (hypertension)   Hypoxia   Lung collapse   Acute respiratory failure with hypoxia (HCC)  Postobstructive pneumonia -  CT chest with contrast on admission showing large subcarinal mass obstructing the left main stem bronchus , patient with known history of lung cancer status post resection , versus Questionable mucous plugging especially with  recent chest x-ray prior to admission with no acute findings,  treated with IV Rocephin and azithromycin for CAP, currently off IV azithromycin , will finish IV Rocephin tomorrow did require BiPAP intermittently. - requiring 4 L nasal cannula . -  patient with aggressive pulmonary toilet, including chest PT, Mucinex, N-acetylcysteine , patient could not tolerate chest wrist , despite that continues to have progressive worsening of left lung opacification ,  with today's x-ray showing evidence of complete left lung collapse with left shift . followed by pulmonary, they discussed bronchoscopy(s) family, patient high risk for intubation during procedure, as per pulmonary discussion with family they don't want bronchoscopy , and they would like to proceed for home discharge with hospice, palliative care consulted.   acute hypoxic respiratory failure - Most likely related to  postobstructive pneumonia - Titrate O2 as tolerated, requiring BiPAP as needed as well,   COPD  - No active wheezing, continue with nebs as needed , off IV steroids  Hypertension  - Continue with amlodipine   Hyponatremia - Most likely related to  pulmonary disease, and dehydration, improving  with gentle hydration  Protein calorie malnutrition - Continue ensure  Code Status: DO NOT RESUSCITATE  Family Communication: none at bedside, tried to reach daughter over the phone, line is busy multiple time.  Disposition Plan:  remains in stepdown, palliative care consult, plan is for home with home hospice in a.m. , will need home oxygen , pulmonary already notified case management .  Procedures    Consults   PCCM   Medications  Scheduled Meds: . amLODipine  5 mg Oral Daily  . antiseptic oral rinse  7 mL Mouth Rinse BID  . azithromycin  500 mg Oral Daily  . cefTRIAXone (ROCEPHIN)  IV  1 g Intravenous Q24H  . enoxaparin (LOVENOX) injection  30 mg Subcutaneous Q24H  . feeding supplement (ENSURE ENLIVE)  237 mL Oral TID BM  . guaiFENesin  1,200 mg Oral BID  . ipratropium-albuterol  3 mL Nebulization QID  . sodium chloride  3 mL Intravenous Q12H  . sodium chloride  3 mL Intravenous Q12H   Continuous Infusions: . sodium chloride 50 mL/hr at 11/06/14 1400   PRN Meds:.sodium chloride, acetaminophen **  OR** acetaminophen, albuterol, HYDROcodone-acetaminophen, midazolam, morphine injection, ondansetron **OR** ondansetron (ZOFRAN) IV, sodium chloride, zolpidem  DVT Prophylaxis  Lovenox -   Lab Results  Component Value Date   PLT 343 11/06/2014    Antibiotics    Anti-infectives    Start     Dose/Rate Route Frequency Ordered Stop   11/05/14 2000  azithromycin (ZITHROMAX) tablet 500 mg     500 mg Oral Daily 11/05/14 1409     11/01/14 2200  azithromycin (ZITHROMAX) 500 mg in dextrose 5 % 250 mL IVPB  Status:  Discontinued     500 mg 250 mL/hr over 60 Minutes Intravenous Every  24 hours 11/01/14 1015 11/05/14 1409   11/01/14 1100  cefTRIAXone (ROCEPHIN) 1 g in dextrose 5 % 50 mL IVPB     1 g 100 mL/hr over 30 Minutes Intravenous Every 24 hours 11/01/14 1019     11/01/14 0100  piperacillin-tazobactam (ZOSYN) IVPB 3.375 g  Status:  Discontinued     3.375 g 12.5 mL/hr over 240 Minutes Intravenous 3 times per day 10/31/14 2015 11/01/14 1015   10/31/14 2200  levofloxacin (LEVAQUIN) IVPB 750 mg  Status:  Discontinued     750 mg 100 mL/hr over 90 Minutes Intravenous Every 48 hours 10/31/14 2015 11/01/14 1015   10/31/14 2030  piperacillin-tazobactam (ZOSYN) IVPB 3.375 g  Status:  Discontinued     3.375 g 100 mL/hr over 30 Minutes Intravenous  Once 10/31/14 2015 11/01/14 0023   10/31/14 1915  piperacillin-tazobactam (ZOSYN) IVPB 3.375 g  Status:  Discontinued     3.375 g 100 mL/hr over 30 Minutes Intravenous  Once 10/31/14 1902 10/31/14 2013   10/31/14 1730  cefTRIAXone (ROCEPHIN) 1 g in dextrose 5 % 50 mL IVPB     1 g 100 mL/hr over 30 Minutes Intravenous  Once 10/31/14 1724 10/31/14 2001   10/31/14 1730  azithromycin (ZITHROMAX) 500 mg in dextrose 5 % 250 mL IVPB     500 mg 250 mL/hr over 60 Minutes Intravenous  Once 10/31/14 1724 10/31/14 2121          Objective:   Filed Vitals:   11/06/14 0721 11/06/14 0824 11/06/14 1202 11/06/14 1247  BP: 118/42  134/86   Pulse: 96  105   Temp: 98.1 F (36.7 C)  98 F (36.7 C)   TempSrc: Oral  Oral   Resp: 17  29   Height:      Weight:      SpO2: 95% 99% 91% 92%    Wt Readings from Last 3 Encounters:  10/31/14 48.1 kg (106 lb 0.7 oz)     Intake/Output Summary (Last 24 hours) at 11/06/14 1619 Last data filed at 11/06/14 1400  Gross per 24 hour  Intake   1730 ml  Output   1300 ml  Net    430 ml     Physical Exam  Awake Alert,,  Supple Neck,No JVD,  Symmetrical Chest wall movement, significantly decreased air entry on the left, no active wheezing, mild respiratory distress No Gallops,Rubs or new  Murmurs, No Parasternal Heave +ve B.Sounds, Abd Soft, No tenderness, No organomegaly appriciated, No rebound - guarding or rigidity. No Cyanosis, Clubbing or edema, No new Rash or bruise     Data Review   Micro Results Recent Results (from the past 240 hour(s))  Urine culture     Status: None   Collection Time: 10/31/14  5:44 PM  Result Value Ref Range Status   Specimen Description URINE, CATHETERIZED  Final   Special Requests NONE  Final   Culture NO GROWTH 1 DAY  Final   Report Status 11/01/2014 FINAL  Final  MRSA PCR Screening     Status: None   Collection Time: 10/31/14 10:20 PM  Result Value Ref Range Status   MRSA by PCR NEGATIVE NEGATIVE Final    Comment:        The GeneXpert MRSA Assay (FDA approved for NASAL specimens only), is one component of a comprehensive MRSA colonization surveillance program. It is not intended to diagnose MRSA infection nor to guide or monitor treatment for MRSA infections.   Culture, blood (routine x 2) Call MD if unable to obtain prior to antibiotics being given     Status: None   Collection Time: 10/31/14 10:50 PM  Result Value Ref Range Status   Specimen Description BLOOD RIGHT HAND  Final   Special Requests IN PEDIATRIC BOTTLE 1CC  Final   Culture NO GROWTH 5 DAYS  Final   Report Status 11/05/2014 FINAL  Final  Culture, blood (routine x 2) Call MD if unable to obtain prior to antibiotics being given     Status: None   Collection Time: 10/31/14 11:05 PM  Result Value Ref Range Status   Specimen Description BLOOD RIGHT ARM  Final   Special Requests BOTTLES DRAWN AEROBIC ONLY 5CC  Final   Culture NO GROWTH 5 DAYS  Final   Report Status 11/05/2014 FINAL  Final    Radiology Reports Ct Angio Chest Pe W/cm &/or Wo Cm  10/31/2014  CLINICAL DATA:  Chest pain and left upper quadrant abdominal pain. Shortness of breath. Low O2 saturation. Nausea. History of lung cancer with left lobectomy. EXAM: CT ANGIOGRAPHY CHEST WITH CONTRAST  TECHNIQUE: Multidetector CT imaging of the chest was performed using the standard protocol during bolus administration of intravenous contrast. Multiplanar CT image reconstructions and MIPs were obtained to evaluate the vascular anatomy. CONTRAST:  53m OMNIPAQUE IOHEXOL 350 MG/ML SOLN COMPARISON:  Chest x-ray dated 10/31/2014, 10/30/2014 and 12/29/2012 and chest CT dated 01/04/2012 FINDINGS: There are no pulmonary emboli. There is a 6 x 3 x 3 cm mass just below the carina extending into the left hilum obstructing the bronchus to the remaining portion of the left lung. This mass also compresses the esophagus at the level of the carina. There is almost complete collapse/consolidation of the remaining portion of the left lung. There is either tumor or fluid in the pericardium to the left of midline. Fairly extensive coronary artery disease. Heart size is normal. Severe emphysema of the right upper lobe. Visualized portion of the upper abdomen demonstrates no acute abnormality. Chronic biliary ductal dilatation. Chronic a parapelvic cyst on the left kidney. Extensive aortic atherosclerosis. Chronic accentuation of the thoracic kyphosis. Review of the MIP images confirms the above findings. 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 Pericardial tumor or fluid adjacent to the mass. Almost complete collapse/ consolidation of the remaining portion of the left lung. Severe emphysema in the right upper lobe. Electronically Signed   By: JLorriane ShireM.D.   On: 10/31/2014 18:20   Dg Chest Port 1 View  11/06/2014  CLINICAL DATA:  Shortness of breath EXAM: PORTABLE CHEST 1 VIEW COMPARISON:  Portable chest x-ray of 11/05/2014 FINDINGS: There is further loss of aeration of the portion of the left upper lung for the was aerated previously. There is complete opacification now present of the left lung with significant mediastinal shift to the left indicating significant  volume loss. The right lung  remains hyperaerated. Heart size is difficult to assess. IMPRESSION: There is now complete collapse of the left lung with mediastinal shift to the left indicating atelectasis. The right lung appears grossly clear. Electronically Signed   By: Ivar Drape M.D.   On: 11/06/2014 08:05   Dg Chest Port 1 View  11/05/2014  CLINICAL DATA:  Subsequent encounter for shortness of breath EXAM: PORTABLE CHEST 1 VIEW COMPARISON:  11/04/2014 FINDINGS: 0713 hours. Continued further opacification of the left hemi thorax with apparent associated increase and left hemithoracic volume loss. Imaging features are suspicious for worsening collapse/ consolidation of the residual left lung. Right lung is hyperexpanded without focal airspace consolidation. Interstitial markings are diffusely coarsened with chronic features. Bones are diffusely demineralized. Telemetry leads overlie the chest. IMPRESSION: Worsening collapse/consolidation in the residual left lung. Electronically Signed   By: Misty Stanley M.D.   On: 11/05/2014 07:49   Dg Chest Portable 1 View  11/04/2014  CLINICAL DATA:  pneumonia.  COPD.  Hypertension. EXAM: PORTABLE CHEST 1 VIEW COMPARISON:  One day prior FINDINGS: Tracheal deviation to the left. Moderate cardiomegaly. Surgical clips about the left hilum. Small to moderate left pleural effusion. No pneumothorax. The right lung remains clear. Pleural-parenchymal opacity in the inferior left hemi thorax is increased. IMPRESSION: Worsened left-sided aeration. Suspicious for infection, superimposed upon postoperative atelectasis. Increase in small to moderate left pleural effusion. Electronically Signed   By: Abigail Miyamoto M.D.   On: 11/04/2014 08:31   Dg Chest Port 1 View  11/03/2014  CLINICAL DATA:  Lung collapse.  COPD.  Asthma. EXAM: PORTABLE CHEST 1 VIEW COMPARISON:  11/02/2014 FINDINGS: Patient rotated left. Cardiomegaly accentuated by AP portable technique. Volume loss in the left hemi thorax with  pleural-parenchymal opacity within the inferior left hemi thorax. No pneumothorax. Right costophrenic angle blunting is likely due to pleural thickening. Right-sided hyperinflation. Diffuse interstitial thickening. Surgical changes about the left hilum. Atherosclerosis in the transverse aorta. IMPRESSION: No significant change since one day prior. Volume loss with presumed postoperative pleural-parenchymal opacity in the inferior left hemi thorax. No pneumothorax or other acute complication. Electronically Signed   By: Abigail Miyamoto M.D.   On: 11/03/2014 09:25   Dg Chest Port 1 View  11/02/2014  CLINICAL DATA:  Hypoxia EXAM: PORTABLE CHEST 1 VIEW COMPARISON:  Study obtained earlier in the day FINDINGS: Postoperative changes again noted on the left with shift of heart and mediastinum toward the left, stable. There is volume loss the left base with probable small effusion. The right lung is hyperexpanded but clear. Heart size is upper normal with pulmonary vascularity within normal limits given the postoperative change on the left. No adenopathy. There is atherosclerotic calcification on the left. No bone lesions. IMPRESSION: No appreciable change compared to study obtained earlier in the day. Electronically Signed   By: Lowella Grip III M.D.   On: 11/02/2014 13:40   Dg Chest Port 1 View  11/02/2014  CLINICAL DATA:  Mucous plugging. EXAM: PORTABLE CHEST 1 VIEW COMPARISON:  10/31/2014. FINDINGS: Mediastinum is normal. Postsurgical changes left lung. Atelectatic changes left lung base again noted With left pleural effusion again noted. No pneumothorax. Cardiomegaly. No pulmonary venous congestion. No pneumothorax. No acute bony abnormality . IMPRESSION: 1. Postsurgical changes left lung. 2. Persistent atelectatic changes left lung base with persistent left pleural effusion. Electronically Signed   By: Marcello Moores  Register   On: 11/02/2014 07:33   Dg Chest Portable 1 View  10/31/2014  CLINICAL  DATA:  Left  upper abdominal pain, chest pain for 1 week. Worsening today. Shortness of breath. Low O2 sats. History of lung cancer EXAM: PORTABLE CHEST 1 VIEW COMPARISON:  10/30/2014 at [place]: Postoperative changes on the left. Worsening aeration on the left with decreasing lung volumes and increasing airspace opacity, presumably atelectasis with the worsening and volume loss. Right lung is clear. Heart is normal size. Underlying COPD. No visible effusions or acute bony abnormality. IMPRESSION: Worsening aeration on the left with increasing airspace disease and worsening volume loss, presumably related to atelectasis. COPD. Electronically Signed   By: Rolm Baptise M.D.   On: 10/31/2014 16:58     CBC  Recent Labs Lab 10/31/14 1630 11/01/14 0545 11/02/14 0243 11/03/14 0850 11/05/14 0250 11/06/14 0230  WBC 11.1* 10.9* 16.4* 15.1* 8.7 8.4  HGB 12.6 12.9 12.4 12.4 11.7* 11.3*  HCT 37.2 37.1 36.2 36.7 35.1* 34.0*  PLT 388 327 359 364 323 343  MCV 88.6 88.3 88.9 89.3 90.2 89.7  MCH 30.0 30.7 30.5 30.2 30.1 29.8  MCHC 33.9 34.8 34.3 33.8 33.3 33.2  RDW 13.3 13.2 13.4 13.9 14.1 14.1  LYMPHSABS 1.5  --   --   --   --   --   MONOABS 1.0  --   --   --   --   --   EOSABS 0.0  --   --   --   --   --   BASOSABS 0.0  --   --   --   --   --     Chemistries   Recent Labs Lab 10/31/14 1630  11/01/14 0545 11/02/14 0243 11/03/14 0850 11/05/14 0250 11/06/14 0230  NA 130*  --  126* 130* 132* 132* 136  K 4.0  --  3.5 3.8 3.8 4.4 4.7  CL 92*  --  88* 90* 100* 96* 98*  CO2 22  --  '25 27 25 27 28  '$ GLUCOSE 147*  --  168* 82 164* 87 118*  BUN 16  --  12 39* 30* 24* 22*  CREATININE 0.80  < > 0.70 1.24* 0.67 0.50 0.61  CALCIUM 10.2  --  8.9 9.5 8.7* 8.4* 8.9  AST 27  --   --   --   --   --   --   ALT 14  --   --   --   --   --   --   ALKPHOS 96  --   --   --   --   --   --   BILITOT 0.6  --   --   --   --   --   --   < > = values in this interval not  displayed. ------------------------------------------------------------------------------------------------------------------ estimated creatinine clearance is 43 mL/min (by C-G formula based on Cr of 0.61). ------------------------------------------------------------------------------------------------------------------ No results for input(s): HGBA1C in the last 72 hours. ------------------------------------------------------------------------------------------------------------------ No results for input(s): CHOL, HDL, LDLCALC, TRIG, CHOLHDL, LDLDIRECT in the last 72 hours. ------------------------------------------------------------------------------------------------------------------ No results for input(s): TSH, T4TOTAL, T3FREE, THYROIDAB in the last 72 hours.  Invalid input(s): FREET3 ------------------------------------------------------------------------------------------------------------------ No results for input(s): VITAMINB12, FOLATE, FERRITIN, TIBC, IRON, RETICCTPCT in the last 72 hours.  Coagulation profile No results for input(s): INR, PROTIME in the last 168 hours.  No results for input(s): DDIMER in the last 72 hours.  Cardiac Enzymes No results for input(s): CKMB, TROPONINI, MYOGLOBIN in the last 168 hours.  Invalid input(s): CK ------------------------------------------------------------------------------------------------------------------ Invalid input(s): POCBNP  Time Spent in minutes   35 minutes   Katherinne Mofield M.D on 11/06/2014 at 4:19 PM  Between 7am to 7pm - Pager - [phone]  After 7pm go to [email or web address] - password [facility]  [facility]   Office  [phone]

## 2014-11-06 NOTE — Progress Notes (Signed)
Name: Tamara Key MRN: [id number] DOB: 1940-10-23    ADMISSION DATE:  10/31/2014 CONSULTATION DATE:  11/01/14  REFERRING MD :  Dr. Waldron Labs  CHIEF COMPLAINT:  Carinal Mass/Post-Obstructive PNA  BRIEF PATIENT DESCRIPTION: 74 y/o F, current smoker, with a PMH of COPD and lung cancer s/p resection and chemotherapy x3 in 2009 (Dr. Arlyce Dice, followed by Dr. Hinton Rao in [place]) who presented to [facility] on 10/12 with L sided chest pain & SOB for one week.  Work up concerning for left subcarinal mass with post-obstructive PNA.  PCCM consulted.   SIGNIFICANT EVENTS  04/2007  Last seen by Dr. Arlyce Dice for follow up CXR after resection and chemotherapy  10/12  Admit to [facility] with 1 week hx of chest pain & SOB.  Work up concerning for L subcarinal mass and post obstructive PNA  STUDIES:  10/12  CTA Chest >> no evidence of PE, large subcarinal mass (6x3x3 cm) obstructing the left mainstem bronchus, pericardial tumor or fluid adjacent to the mass, almost complete collapse/consolidation of the remaining portion of the lung, severe emphysema in RUL gies and polydipsia.  SUBJECTIVE:   Much improved.  Denies SOB, chest pain is better.  Visiting with family, working on flutter.  No bipap required overnight.   VITAL SIGNS: Temp:  [97.7 F (36.5 C)-98.7 F (37.1 C)] 98 F (36.7 C) (10/18 1202) Pulse Rate:  [88-109] 105 (10/18 1202) Resp:  [13-29] 29 (10/18 1202) BP: (102-134)/(41-86) 134/86 mmHg (10/18 1202) SpO2:  [91 %-99 %] 92 % (10/18 1247)  PHYSICAL EXAMINATION: General:  Thin elderly female  NAD Neuro:  Awake, alert, oriented, appropriate, gen weakness  Head: [place]/AT. EENT:  MM pink/moist, no jvd, on [place] Cardiovascular:  s1s2 rrr, no m/r/g, distant tones  Lungs: resps even, non labored on [place], working on flutter valve, diminished L, few scattered rhonchi  Abdomen:  Flat, soft, bsx4 active  Musculoskeletal:  No acute deformities  Skin:  Warm/dry, no edema    Recent Labs Lab 11/03/14 0850  11/05/14 0250 11/06/14 0230  NA 132* 132* 136  K 3.8 4.4 4.7  CL 100* 96* 98*  CO2 '25 27 28  '$ BUN 30* 24* 22*  CREATININE 0.67 0.50 0.61  GLUCOSE 164* 87 118*    Recent Labs Lab 11/03/14 0850 11/05/14 0250 11/06/14 0230  HGB 12.4 11.7* 11.3*  HCT 36.7 35.1* 34.0*  WBC 15.1* 8.7 8.4  PLT 364 323 343   Dg Chest Port 1 View  11/06/2014  CLINICAL DATA:  Shortness of breath EXAM: PORTABLE CHEST 1 VIEW COMPARISON:  Portable chest x-ray of 11/05/2014 FINDINGS: There is further loss of aeration of the portion of the left upper lung for the was aerated previously. There is complete opacification now present of the left lung with significant mediastinal shift to the left indicating significant volume loss. The right lung remains hyperaerated. Heart size is difficult to assess. IMPRESSION: There is now complete collapse of the left lung with mediastinal shift to the left indicating atelectasis. The right lung appears grossly clear. Electronically Signed   By: Ivar Drape M.D.   On: 11/06/2014 08:05   Dg Chest Port 1 View  11/05/2014  CLINICAL DATA:  Subsequent encounter for shortness of breath EXAM: PORTABLE CHEST 1 VIEW COMPARISON:  11/04/2014 FINDINGS: 0713 hours. Continued further opacification of the left hemi thorax with apparent associated increase and left hemithoracic volume loss. Imaging features are suspicious for worsening collapse/ consolidation of the residual left lung. Right lung is hyperexpanded without focal airspace  consolidation. Interstitial markings are diffusely coarsened with chronic features. Bones are diffusely demineralized. Telemetry leads overlie the chest. IMPRESSION: Worsening collapse/consolidation in the residual left lung. Electronically Signed   By: Misty Stanley M.D.   On: 11/05/2014 07:49   I reviewed chest CT myself, subcarinal node noted.  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  Initial CXR 10/11 at [facility] was not far off from baseline film post lobectomy.  10/12 imaging now shows significant change with left sided opacity and volume loss raises concern for acute process like mucus plugging +/- post obstructive process.  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 - Patient and family elected no bronch and no treatment regarding mass, patient does not wish for any form of treatment even if cancer.  Will cancel bronch.  COPD  - Bronchodilators - Hold steroids for now.  Post-Obstructive PNA  - Rocephin last dose 10/19. - D/C zithromax.  Tobacco Abuse - ongoing smoking up to admission  - Smoking cessation.  Delirium vs Dementia -  Not clear on her baseline mental status but seems appropriate today, no further interventions needed.  Weight Loss / Concern for FTT  AKI  Plan: - Social work to obtain home O2. - Continue mucomyst BID through 10/17 - Max pulmonary hygiene as tolerated D/C chest vest - IS, flutter  - D/C bipap  - Continued ceftriaxone, consider dc azithro - Pain control - change morphine to q3h for more effective control to allow pulm hygiene  - Palliative care consult to arrange for home hospice. - No further imaging at this point. - Spoke with patient and family extensively.  After discussion, decision was made to make patient a full DNR, no bronchoscopy, qualify for home O2 and have palliative care see patient to arrange for home hospice for symptom control.  PCCM will sign off, please call back if needed.  Discussed with bedside RN and family.  Rush Farmer, M.D. [facility] Pulmonary/Critical Care Medicine. Pager: [phone]. After hours pager: [phone].

## 2014-11-06 NOTE — Progress Notes (Signed)
Patient called out with call bell and told secretary "I need my nurse. I'm choking". When I immediately went into room patient was upset and stated "I have been calling since 10 o'clock. I've been calling for an hour for someone". Reassured patient it was 10 pm right now and that I had been in her room in the last hour. Patient constantly using flutter valve, WOB of breathing is the same as last assessment but does have wheezing. Called RT for breathing treatment and provided reassurance and emotional support.

## 2014-11-06 NOTE — Progress Notes (Signed)
Pt did not want chest pt at this time.

## 2014-11-07 DIAGNOSIS — R918 Other nonspecific abnormal finding of lung field: Secondary | ICD-10-CM

## 2014-11-07 DIAGNOSIS — E871 Hypo-osmolality and hyponatremia: Secondary | ICD-10-CM

## 2014-11-07 DIAGNOSIS — Z515 Encounter for palliative care: Secondary | ICD-10-CM

## 2014-11-07 DIAGNOSIS — Z7189 Other specified counseling: Secondary | ICD-10-CM

## 2014-11-07 LAB — CREATININE, SERUM: Creatinine, Ser: 0.62 mg/dL (ref 0.44–1.00)

## 2014-11-07 MED ORDER — ALPRAZOLAM 0.25 MG PO TABS
0.2500 mg | ORAL_TABLET | Freq: Two times a day (BID) | ORAL | Status: DC | PRN
  Administered 2014-11-08: 0.25 mg via ORAL
  Filled 2014-11-07: qty 1

## 2014-11-07 MED ORDER — MORPHINE SULFATE (CONCENTRATE) 10 MG/0.5ML PO SOLN
2.5000 mg | ORAL | Status: DC | PRN
  Administered 2014-11-07: 2.6 mg via ORAL
  Filled 2014-11-07: qty 0.5

## 2014-11-07 NOTE — Progress Notes (Signed)
Physical Therapy Treatment Patient Details Name: Tamara Key MRN: [id number] DOB: January 01, 1941 Today's Date: 11/07/2014    History of Present Illness 74 y/o F, current smoker, with a PMH of COPD and lung cancer s/p resection and chemotherapy x3 in 2009 (Dr. Arlyce Dice, followed by Dr. Hinton Rao in [place]) who presented to [facility] on 10/12 with L sided chest pain & SOB for one week. Work up concerning for left subcarinal mass with post-obstructive PNA    PT Comments    Pt admitted with above diagnosis. Pt currently with functional limitations due to balance and endurance deficits. Pt able to ambulate a short distance with RW.  Limited by DOE with desat after a short distance even with O2 on.   Pt will benefit from skilled PT to increase their independence and safety with mobility to allow discharge to the venue listed below.    Follow Up Recommendations  SNF;Supervision/Assistance - 24 hour (vs home with family)     Equipment Recommendations  Other (comment) (TBA)    Recommendations for Other Services       Precautions / Restrictions Precautions Precautions: Fall Restrictions Weight Bearing Restrictions: No    Mobility  Bed Mobility Overal bed mobility: Needs Assistance Bed Mobility: Supine to Sit     Supine to sit: Min guard     General bed mobility comments: On arrival, pt on bedpan.  Cleaned pt with total assist and got her off bedpan.  min guard for safety, patient was able to get to EOB by herself but had no awareness of lines  Transfers Overall transfer level: Needs assistance Equipment used: Rolling walker (2 wheeled) Transfers: Sit to/from Stand Sit to Stand: Min assist         General transfer comment: Min assist for stability, patient with increased effort to perform tasks VCs for hand placement and safety  Ambulation/Gait Ambulation/Gait assistance: Min assist Ambulation Distance (Feet): 8 Feet Assistive device: Rolling walker (2 wheeled) Gait  Pattern/deviations: Step-to pattern   Gait velocity interpretation: Below normal speed for age/gender General Gait Details: patient able to take several steps from bed walking around bed to chair with RW, increase effort and DOE noted, O2 saturations dropped to 86% with activity on  4 liters but quickly returned to >90% after 1 min rest in chair.   patient appears significantly fatigued   Once in chairstated she had to use bathroom again therefore obtained 3N1 and got pt on that and she urinated and had BM again.  Cleaned her again with total assist and placed back in recliner.  Pt min assist for steadying even with RW.    Stairs            Wheelchair Mobility    Modified Rankin (Stroke Patients Only)       Balance Overall balance assessment: Needs assistance         Standing balance support: Bilateral upper extremity supported;During functional activity Standing balance-Leahy Scale: Poor Standing balance comment: requires use of RW                    Cognition Arousal/Alertness: Awake/alert Behavior During Therapy: Restless;Anxious;Impulsive Overall Cognitive Status: Impaired/Different from baseline Area of Impairment: Awareness;Problem solving           Awareness: Intellectual Problem Solving: Difficulty sequencing;Requires verbal cues      Exercises      General Comments        Pertinent Vitals/Pain Pain Assessment: No/denies pain  Desat to 86% on 4LO2 with  ambulation.  Pt sats 93-97% at rest on 4LO2.      Home Living                      Prior Function            PT Goals (current goals can now be found in the care plan section) Progress towards PT goals: Progressing toward goals    Frequency  Min 3X/week    PT Plan Current plan remains appropriate    Co-evaluation             End of Session Equipment Utilized During Treatment: Gait belt;Oxygen (4LO2) Activity Tolerance: Patient limited by fatigue Patient left: in  chair;with call bell/phone within reach;with chair alarm set;with family/visitor present     Time: 1005-1030 PT Time Calculation (min) (ACUTE ONLY): 25 min  Charges:  $Gait Training: 8-22 mins $Self Care/Home Management: 8-22                    G CodesIrwin Brakeman F 11-23-14, 11:16 AM Aikam Hellickson,PT Acute Rehabilitation [phone] (pager)

## 2014-11-07 NOTE — Progress Notes (Signed)
Nutrition Follow-up  DOCUMENTATION CODES:   Not applicable  INTERVENTION:   -Continue Ensure Enlive po TID, each supplement provides 350 kcal and 20 grams of protein  NUTRITION DIAGNOSIS:   Inadequate oral intake related to poor appetite as evidenced by meal completion < 25%.  Ongoing   GOAL:   Patient will meet greater than or equal to 90% of their needs  Unmet  MONITOR:   PO intake, Supplement acceptance, Labs, Weight trends, Skin, I & O's  REASON FOR ASSESSMENT:   Malnutrition Screening Tool    ASSESSMENT:   74 y/o F, current smoker, with a PMH of COPD and lung cancer s/p resection and chemotherapy x3 in 2009 (Dr. Arlyce Dice, followed by Dr. Hinton Rao in [place]) who presented to [facility] on 10/12 with L sided chest pain & SOB for one week. Work up concerning for left subcarinal mass with post-obstructive PNA. PCCM consulted.   Per CCM notes, pt and family elected no to pursue bronchoscopy.   Pt remains on a regular diet (PO: 0-25%). She is consuming Ensure supplements per MAR. RD will continue order.   Per chart review, plans are to d/c home with hospice services. RNCM following.   Labs reviewed.  Diet Order:  Diet Heart Room service appropriate?: Yes; Fluid consistency:: Thin  Skin:  Reviewed, no issues  Last BM:  11/07/14  Height:   Ht Readings from Last 1 Encounters:  10/31/14 5' (1.524 m)    Weight:   Wt Readings from Last 1 Encounters:  10/31/14 106 lb 0.7 oz (48.1 kg)    Ideal Body Weight:  45.5 kg  BMI:  Body mass index is 20.71 kg/(m^2).  Estimated Nutritional Needs:   Kcal:  1450-1650  Protein:  65-80 grams  Fluid:  >1.4 L  EDUCATION NEEDS:   No education needs identified at this time  Tamara Key A. Jimmye Norman, RD, LDN, CDE Pager: [phone] After hours Pager: [phone]

## 2014-11-07 NOTE — Consult Note (Signed)
Consultation Note Date: 11/07/2014   Patient Name: Tamara Key  DOB: Sep 21, 1940  MRN: [id number]  Age / Sex: 74 y.o., female  PCP: No Pcp Per Patient Referring Physician: Modena Jansky, MD  Reason for Consultation: Establishing goals of care and Psychosocial/spiritual support    Clinical Assessment/Narrative: Mrs. Blankenhorn is sitting quietly in her chair as I enter.  She greets me, making and keeping eye contact.  Mrs. Volker talks about her friends who live in her apartment building who has visited her.  She tells me that 2 doctors told her a year ago that she would not live a year, her response was that only God knows.   She goes on to tell me that they wanted to do surgery then, and she declined.  She says she went through a tube and was told the same thing again.  She has limited health literacy.  She talks almost non stop while I am there, even though breathless.  She talks at length about her friends wooden church that was given to her.    Grand daughter Donella Stade enters and we continue talking.  I ask about HCPOA and although Mrs. Boehm states she doesn't have one, she talks about funeral arrangements have been paid, and that her children know what she wants.  She states this very matter of fact, as thought she does not wish to review any further.  Crystal states she thinks her mother, Rayfield Citizen is HCPOA.  Mrs. Siracusa states, "I'm crazy not stupid".   There is discussion about where Mrs. Blackard will go after discharge, and Crystal states Morey Hummingbird is getting a room ready in her home.  Mrs. Thursby states "back then" she told me she didn't have room for me.  I suggest that Mrs. Adcock wants Morey Hummingbird to ask her to live with them.  Crystal states that [place] did not want to take away Mrs. Augsburger's independence.  Crystal tells me later outside the room as Mrs. Howeth is toileting that Morey Hummingbird has been going to her home almost  daily and that yesterday Mrs. Ramser told her that she never came to visit.  This hurt Morey Hummingbird who goes daily on her lunch.    We talk about symptom management and Mrs. Mikita denies pain, anxiety or trouble sleeping.  She states repeatedly that God will give her as much sleep as she needs. We talk about anxiety medications and benefits. We talk about pain meds/Morphine and benefits (opening heart and lung vessels, reducing work of breathing).  Mrs. Stull states emphatically, "No", that she does not want to have any pain medications, again stating "God wont put more on you than you can handle/the Reita Cliche will only give what you can handle".  Crystal talks about the "suddenness" of this change.  She tells me that they only knew that the cancer may be back, that Mrs. Muckey didn't share with them the severity of her illness.   We talk about going home with Hospice and I ask Mrs. Rosenow if she want to know what this time will look like. She basically tells me no.  I share the importance of leaning on her Hospice staff and calling them with any questions or concerns.  I share that they will increase services as time goes on.  I share with Crystal my concern that Mrs. Lafavor may have a sudden change with her breathing, and to lean on hospice. I ask what is most important to Mrs. Parlee at this time, and she  states, "One day at a time", and her worry is "nothing", again stating "worry is interest paid back".     Contacts/Participants in Discussion:  Mrs. Vanderweele and her granddaughter Medical sales representative Maker: Mrs. Autry is able to make her own choices at this time.  We discuss the role of HCPOA. She has not chosen a representative at this point, Engineering geologist thinks this may be her mother Morey Hummingbird). I advise that the children will make decisions as a group if Mrs. Humber declines to name someone specific.  She states "they know what I want".    Relationship to Patient  HCPOA: Unsure, Mrs. Craigo states no, but  grand daughter, Donella Stade,  thinks this may be her mother Rayfield Citizen ,   We also discuss durable POA.    SUMMARY OF RECOMMENDATIONS  Code Status/Advance Care Planning: DNR    Code Status Orders        Start     Ordered   11/06/14 1331  Do not attempt resuscitation (DNR)   Continuous    Question Answer Comment  Maintain current active treatments Yes   Do not initiate new interventions Yes      11/06/14 1330      Other Directives:Other, NO other AD, Crystal states they have talked about end of life issues for many years in their family.   Symptom Management:   Tylenol 650 mg PO/PR Q 6 hours PRN  Morphine ( not used, patient states NO when asked about use) states she will never use this medication.  I share the benefits with she and Crystal.   Palliative Prophylaxis:   regualr BM a this time, no narcotic regimen.    Psycho-social/Spiritual:  Support System: Strong Desire for further Chaplaincy support:no, Mrs. Mattos talks several times of a church with [employer] at the top that was given to her by her friends daughter when her friend died.  She states she can talk to God anytime.  Additional Recommendations: Caregiving  Support/Resources and Education on Hospice  Prognosis: < 6 months, likely closer to 2 months.   Discharge Planning: Home with Hospice to her daughter Morey Hummingbird [place]'s home, ( home Mrs. Plazola's father built).  There had been some concern about placement, and although Morey Hummingbird is getting the home ready for Mrs. Thelen, Mrs. Spurrier states that her daughter doesn't want her there, going back to a statement that was made at an earlier date.    Chief Complaint/ Primary Diagnoses: Present on Admission:  . Pneumonia  I have reviewed the medical record, interviewed the patient and family, and examined the patient. The following aspects are pertinent.  Past Medical History  Diagnosis Date  . Cancer ([place])     lung cancer  . COPD (chronic obstructive pulmonary  disease) ([place])   . Asthma   . HTN (hypertension) 11/02/2014   Social History   Social History  . Marital Status: Single    Spouse Name: N/A  . Number of Children: N/A  . Years of Education: N/A   Social History Main Topics  . Smoking status: Current Every Day Smoker  . Smokeless tobacco: None  . Alcohol Use: No  . Drug Use: No  . Sexual Activity: No   Other Topics Concern  . None   Social History Narrative   History reviewed. No pertinent family history. Scheduled Meds: . amLODipine  5 mg Oral Daily  . antiseptic oral rinse  7 mL Mouth Rinse BID  . enoxaparin (LOVENOX) injection  30 mg  Subcutaneous Q24H  . feeding supplement (ENSURE ENLIVE)  237 mL Oral TID BM  . guaiFENesin  1,200 mg Oral BID  . ipratropium-albuterol  3 mL Nebulization QID  . sodium chloride  3 mL Intravenous Q12H  . sodium chloride  3 mL Intravenous Q12H   Continuous Infusions:  PRN Meds:.sodium chloride, acetaminophen **OR** acetaminophen, albuterol, HYDROcodone-acetaminophen, midazolam, morphine injection, ondansetron **OR** ondansetron (ZOFRAN) IV, sodium chloride, zolpidem Medications Prior to Admission:  Prior to Admission medications   Medication Sig Start Date End Date Taking? Authorizing Provider  amLODipine (NORVASC) 5 MG tablet Take 5 mg by mouth daily. 07/27/14  Yes Historical Provider, MD  azithromycin (ZITHROMAX) 250 MG tablet as directed. 5 day dosepack 10/30/14  Yes Historical Provider, MD  COMBIVENT RESPIMAT 20-100 MCG/ACT AERS respimat Inhale 1 puff into the lungs 4 (four) times daily. 10/17/14  Yes Historical Provider, MD   No Known Allergies CBC:    Component Value Date/Time   WBC 8.4 11/06/2014 0230   HGB 11.3* 11/06/2014 0230   HCT 34.0* 11/06/2014 0230   PLT 343 11/06/2014 0230   MCV 89.7 11/06/2014 0230   NEUTROABS 8.5* 10/31/2014 1630   LYMPHSABS 1.5 10/31/2014 1630   MONOABS 1.0 10/31/2014 1630   EOSABS 0.0 10/31/2014 1630   BASOSABS 0.0 10/31/2014 1630    Comprehensive Metabolic Panel:    Component Value Date/Time   NA 136 11/06/2014 0230   K 4.7 11/06/2014 0230   CL 98* 11/06/2014 0230   CO2 28 11/06/2014 0230   BUN 22* 11/06/2014 0230   CREATININE 0.62 11/07/2014 0500   GLUCOSE 118* 11/06/2014 0230   CALCIUM 8.9 11/06/2014 0230   AST 27 10/31/2014 1630   ALT 14 10/31/2014 1630   ALKPHOS 96 10/31/2014 1630   BILITOT 0.6 10/31/2014 1630   PROT 8.1 10/31/2014 1630   ALBUMIN 3.7 10/31/2014 1630    Review of Systems  Constitutional: Positive for fatigue. Negative for fever.  HENT: Negative.   Eyes: Negative.   Respiratory: Positive for cough and shortness of breath.   Cardiovascular: Negative for chest pain and leg swelling.  Gastrointestinal: Negative for abdominal pain and abdominal distention.  Endocrine: Negative.   Genitourinary: Negative.   Musculoskeletal: Negative.   Skin: Positive for pallor.  Neurological: Positive for weakness.  Psychiatric/Behavioral: Negative.     Physical Exam  Respiratory: She has decreased breath sounds in the left upper field, the left middle field and the left lower field.  GI: She exhibits no distension. There is no tenderness. There is no guarding.  Neurological: She is alert.  Skin: Skin is warm and dry.  Psychiatric: Her speech is normal. Her affect is blunt. She is aggressive. Cognition and memory are normal.    Vital Signs: BP 144/118 mmHg  Pulse 115  Temp(Src) 97.1 F (36.2 C) (Oral)  Resp 28  Ht 5' (1.524 m)  Wt 48.1 kg (106 lb 0.7 oz)  BMI 20.71 kg/m2  SpO2 96% SpO2: Last BM Date: 11/07/14  O2 Device:SpO2: 96 % O2 Flow Rate: .O2 Flow Rate (L/min): 5 L/min Intake/output summary:  Intake/Output Summary (Last 24 hours) at 11/07/14 1256 Last data filed at 11/07/14 0900  Gross per 24 hour  Intake   1530 ml  Output    550 ml  Net    980 ml   LBM:  BMP Latest Ref Rng 11/07/2014 11/06/2014 11/05/2014  Glucose 65 - 99 mg/dL - 118(H) 87  BUN 6 - 20 mg/dL - 22(H) 24(H)   Creatinine 0.44 -  1.00 mg/dL 0.62 0.61 0.50  Sodium 135 - 145 mmol/L - 136 132(L)  Potassium 3.5 - 5.1 mmol/L - 4.7 4.4  Chloride 101 - 111 mmol/L - 98(L) 96(L)  CO2 22 - 32 mmol/L - 28 27  Calcium 8.9 - 10.3 mg/dL - 8.9 8.4(L)    Baseline Weight: Weight: 48.535 kg (107 lb) Most recent weight: Weight: 48.1 kg (106 lb 0.7 oz)      Palliative Assessment/Data:  Flowsheet Rows        Most Recent Value   Intake Tab    Referral Department  Critical care   Unit at Time of Referral  ICU   Palliative Care Primary Diagnosis  Cancer   Date Notified  11/06/14   Palliative Care Type  New Palliative care   Reason for referral  Clarify Goals of Care, Counsel Regarding Hospice   Date of Admission  10/31/14   # of days IP prior to Palliative referral  6   Clinical Assessment    Psychosocial & Spiritual Assessment    Palliative Care Outcomes            Additional Data Reviewed: Recent Labs     11/05/14  0250  11/06/14  0230  11/07/14  0500  WBC  8.7  8.4   --   HGB  11.7*  11.3*   --   PLT  323  343   --   NA  132*  136   --   BUN  24*  22*   --   CREATININE  0.50  0.61  0.62    Time In:  1045 Time Out: 1230 Time Total:  105 minutes Greater than 50%  of this time was spent counseling and coordinating care related to the above assessment and plan.  Signed by: Drue Novel, NP  Drue Novel, NP  11/07/2014, 12:56 PM  Please contact Palliative Medicine Team phone at [phone] for questions and concerns.

## 2014-11-07 NOTE — Care Management Note (Signed)
Case Management Note  Patient Details  Name: Tamara Key MRN: [id number] Date of Birth: [birth year]  Subjective/Objective:     Pt adm w resp distress               Action/Plan:lives in apt in [place]. Da carrie lives in [place] at [address] [place] [address]   Expected Discharge Date:                  Expected Discharge Plan:  Home w Hospice Care  In-House Referral:     Discharge planning Services  CM Consult  Post Acute Care Choice:  Hospice, Durable Medical Equipment Choice offered to:  Patient  DME Arranged:  Oxygen DME Agency:  [place]:  RN, Social Work [employer] Agency:  [place]  Status of Service:     Medicare Important Message Given:  Yes-third notification given Date Medicare IM Given:    Medicare IM give by:    Date Additional Medicare IM Given:    Additional Medicare Important Message give by:     If discussed at [place] of Stay Meetings, dates discussed:    Additional Comments: md note for home hospice and will need o2. Da feels will need hosp bed and w/c also. Da not sure if pt will go to her apt for week or so or to da home in [place]. Concerned about how hospice agency will work. [facility] as they will go to [place] if pt goes to her apt and will see pt at da's house in [place]. Pt in agreement. Referral faxed to [facility]. Await pal care to see pt and da also.  Hospice will arrange for eq.  Lacretia Leigh, RN 11/07/2014, 9:54 AM

## 2014-11-07 NOTE — Progress Notes (Signed)
RT note- moderated distress from moving from bathroom to bed. Scheduled treatment given at this time.

## 2014-11-07 NOTE — Progress Notes (Signed)
PROGRESS NOTE    Tamara Key NAT:[id number] DOB: Oct 06, 1940 DOA: 10/31/2014 PCP: No PCP Per Patient  HPI/Brief narrative 74 y/o F, current smoker, with a PMH of COPD and lung cancer s/p resection and chemotherapy x3 in 2009 (Dr. Arlyce Dice, followed by Dr. Hinton Rao in [place]) who presented to [facility] on 10/12 with L sided chest pain & SOB for one week. Work up concerning for left subcarinal mass with post-obstructive PNA. PCCM consulted - discussed with patient & family, decision was made to make patient a full DNR, no bronchoscopy, qualify for home O2 and have palliative care see patient to arrange for home hospice for symptom control.   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 Concerning for malignant recurrence versus mucus plugging. - Initial chest x-ray 10/11 at [facility] was not far off from baseline film post lobectomy. However x-ray 10/12 showed significant change with left-sided opacity and volume loss raising concern for acute process like mucous plugging +/- postobstructive process - Hx of Non-Small Cell Lung Cancer s/p Resection (Stage II-A in 2009) - s/p LUL lobectomy & three rounds of chemotherapy. - Patient and family elected no bronch and no treatment regarding mass, patient does not wish for any form of treatment even if cancer. Cancelled bronch. - Awaiting palliative care input regarding symptom management and assistance with home hospice.  Acute respiratory failure with hypoxia - Secondary to lung mass concerning for cancer, left lung collapse, postobstructive pneumonia and COPD - Off BiPAP - Saturating in the low 90s on 4 L/m oxygen via nasal cannula. Dyspneic even with talking  COPD - stable. No clinical bronchospasm - Incentive spirometer and flutter valve  Post-Obstructive PNA - Completes antibiotics 10/19.  - Completed Mucomyst.  Tobacco abuse - Ongoing smoking up to admission. Cessation  counseled.  Dementia versus delirium - Not clear of her baseline mental status but seemed appropriate today.  Weight loss/adult failure to thrive\  Acute kidney injury - Resolved  Anemia, possibly of chronic disease - Stable  Hyponatremia -Secondary to dehydration versus SIADH. Resolved   Protein calorie malnutrition - Nutritional supplements.   DVT prophylaxis: Lovenox  Code Status: DO NOT RESUSCITATE  Family Communication: None at bedside  Disposition Plan: DC home with home hospice pending palliative input, possibly today versus tomorrow.   Consultants:  PCCM  Palliative team- pending  Procedures:  BIPAP-DC'ed  Antibiotics:  Azithromycin 10/12 > 10/17  IV Rocephin 10/12 > 10/19  Levofloxacin 1 dose on 10/12  Zosyn 10/12 > 10/13  Subjective: Denies complaints. Appears dyspneic even with talking a little.  Objective: Filed Vitals:   11/07/14 0753 11/07/14 0806 11/07/14 0927 11/07/14 1000  BP: 140/60  125/49   Pulse: 106     Temp: 97.6 F (36.4 C)     TempSrc: Oral     Resp: 25     Height:      Weight:      SpO2: 95% 94%  94%    Intake/Output Summary (Last 24 hours) at 11/07/14 1052 Last data filed at 11/07/14 0900  Gross per 24 hour  Intake   1680 ml  Output    850 ml  Net    830 ml   Filed Weights   10/31/14 2000 10/31/14 2130  Weight: 48.535 kg (107 lb) 48.1 kg (106 lb 0.7 oz)     Exam:  General exam: Small built and frail pleasant elderly lady lying propped up in  bed with mild increased work of breathing Respiratory system: reduced breath sounds bilaterally without wheezing or rhonchi. Mild increased work of breathing. Cardiovascular system: S1 & S2 heard, RRR. No JVD, murmurs, gallops, clicks or pedal edema. Tele: SR-ST 100's Gastrointestinal system: Abdomen is nondistended, soft and nontender. Normal bowel sounds heard. Central nervous system: Alert and oriented. No focal neurological deficits. Extremities: Symmetric 5 x 5  power.   Data Reviewed: Basic Metabolic Panel:  Recent Labs Lab 11/01/14 0545 11/02/14 0243 11/03/14 0850 11/05/14 0250 11/06/14 0230 11/07/14 0500  NA 126* 130* 132* 132* 136  --   K 3.5 3.8 3.8 4.4 4.7  --   CL 88* 90* 100* 96* 98*  --   CO2 '25 27 25 27 28  '$ --   GLUCOSE 168* 82 164* 87 118*  --   BUN 12 39* 30* 24* 22*  --   CREATININE 0.70 1.24* 0.67 0.50 0.61 0.62  CALCIUM 8.9 9.5 8.7* 8.4* 8.9  --    Liver Function Tests:  Recent Labs Lab 10/31/14 1630  AST 27  ALT 14  ALKPHOS 96  BILITOT 0.6  PROT 8.1  ALBUMIN 3.7   No results for input(s): LIPASE, AMYLASE in the last 168 hours. No results for input(s): AMMONIA in the last 168 hours. CBC:  Recent Labs Lab 10/31/14 1630 11/01/14 0545 11/02/14 0243 11/03/14 0850 11/05/14 0250 11/06/14 0230  WBC 11.1* 10.9* 16.4* 15.1* 8.7 8.4  NEUTROABS 8.5*  --   --   --   --   --   HGB 12.6 12.9 12.4 12.4 11.7* 11.3*  HCT 37.2 37.1 36.2 36.7 35.1* 34.0*  MCV 88.6 88.3 88.9 89.3 90.2 89.7  PLT 388 327 359 364 323 343   Cardiac Enzymes: No results for input(s): CKTOTAL, CKMB, CKMBINDEX, TROPONINI in the last 168 hours. BNP (last 3 results) No results for input(s): PROBNP in the last 8760 hours. CBG: No results for input(s): GLUCAP in the last 168 hours.  Recent Results (from the past 240 hour(s))  Urine culture     Status: None   Collection Time: 10/31/14  5:44 PM  Result Value Ref Range Status   Specimen Description URINE, CATHETERIZED  Final   Special Requests NONE  Final   Culture NO GROWTH 1 DAY  Final   Report Status 11/01/2014 FINAL  Final  MRSA PCR Screening     Status: None   Collection Time: 10/31/14 10:20 PM  Result Value Ref Range Status   MRSA by PCR NEGATIVE NEGATIVE Final    Comment:        The GeneXpert MRSA Assay (FDA approved for NASAL specimens only), is one component of a comprehensive MRSA colonization surveillance program. It is not intended to diagnose MRSA infection nor to  guide or monitor treatment for MRSA infections.   Culture, blood (routine x 2) Call MD if unable to obtain prior to antibiotics being given     Status: None   Collection Time: 10/31/14 10:50 PM  Result Value Ref Range Status   Specimen Description BLOOD RIGHT HAND  Final   Special Requests IN PEDIATRIC BOTTLE 1CC  Final   Culture NO GROWTH 5 DAYS  Final   Report Status 11/05/2014 FINAL  Final  Culture, blood (routine x 2) Call MD if unable to obtain prior to antibiotics being given     Status: None   Collection Time: 10/31/14 11:05 PM  Result Value Ref Range Status   Specimen Description BLOOD RIGHT ARM  Final  Special Requests BOTTLES DRAWN AEROBIC ONLY 5CC  Final   Culture NO GROWTH 5 DAYS  Final   Report Status 11/05/2014 FINAL  Final           Studies: Dg Chest Port 1 View  11/06/2014  CLINICAL DATA:  Shortness of breath EXAM: PORTABLE CHEST 1 VIEW COMPARISON:  Portable chest x-ray of 11/05/2014 FINDINGS: There is further loss of aeration of the portion of the left upper lung for the was aerated previously. There is complete opacification now present of the left lung with significant mediastinal shift to the left indicating significant volume loss. The right lung remains hyperaerated. Heart size is difficult to assess. IMPRESSION: There is now complete collapse of the left lung with mediastinal shift to the left indicating atelectasis. The right lung appears grossly clear. Electronically Signed   By: Ivar Drape M.D.   On: 11/06/2014 08:05        Scheduled Meds: . amLODipine  5 mg Oral Daily  . antiseptic oral rinse  7 mL Mouth Rinse BID  . cefTRIAXone (ROCEPHIN)  IV  1 g Intravenous Q24H  . enoxaparin (LOVENOX) injection  30 mg Subcutaneous Q24H  . feeding supplement (ENSURE ENLIVE)  237 mL Oral TID BM  . guaiFENesin  1,200 mg Oral BID  . ipratropium-albuterol  3 mL Nebulization QID  . sodium chloride  3 mL Intravenous Q12H  . sodium chloride  3 mL Intravenous Q12H    Continuous Infusions: . sodium chloride 50 mL/hr at 11/07/14 0700    Active Problems:   Pneumonia   Hyponatremia   COPD (chronic obstructive pulmonary disease) (HCC)   HTN (hypertension)   Hypoxia   Lung collapse   Acute respiratory failure with hypoxia (HCC)    Time spent: 30 minutes.    Vernell Leep, MD, FACP, FHM. [facility] Pager [phone]  If 7PM-7AM, please contact night-coverage [email or web address] Password [email or web address] 11/07/2014, 10:52 AM    LOS: 7 days

## 2014-11-07 NOTE — Clinical Social Work Note (Signed)
CSW notes that the patient is planning to discharge home with hospice once able. CSW will remain available to assist with any other CSW related needs (i.e. Ambulance transport etc.).  Liz Beach MSW, [place], [place], [phone]

## 2014-11-08 DIAGNOSIS — Z515 Encounter for palliative care: Secondary | ICD-10-CM | POA: Insufficient documentation

## 2014-11-08 DIAGNOSIS — Z7189 Other specified counseling: Secondary | ICD-10-CM | POA: Insufficient documentation

## 2014-11-08 MED ORDER — MORPHINE SULFATE (CONCENTRATE) 10 MG/0.5ML PO SOLN: 2.5000 mg | ORAL | Status: AC | PRN

## 2014-11-08 MED ORDER — IPRATROPIUM-ALBUTEROL 0.5-2.5 (3) MG/3ML IN SOLN: 3.0000 mL | Freq: Four times a day (QID) | RESPIRATORY_TRACT | Status: AC

## 2014-11-08 MED ORDER — ALBUTEROL SULFATE (2.5 MG/3ML) 0.083% IN NEBU: 2.5000 mg | INHALATION_SOLUTION | RESPIRATORY_TRACT | Status: AC | PRN

## 2014-11-08 MED ORDER — ENSURE ENLIVE PO LIQD: 237.0000 mL | Freq: Three times a day (TID) | ORAL | Status: AC

## 2014-11-08 MED ORDER — ALPRAZOLAM 0.25 MG PO TABS: 0.2500 mg | ORAL_TABLET | Freq: Two times a day (BID) | ORAL | Status: AC | PRN

## 2014-11-08 NOTE — Care Management Note (Signed)
Case Management Note  Patient Details  Name: Tamara Key MRN: [id number] Date of Birth: Feb 16, 1940  Subjective/Objective:  Patient's equipment has been delivered.  Oxygen in room.  Hospice reports ready for patient to be discharged.  Daughter will transport, will need two prescriptions on discharge, one for pain - recommending MS concentrate, and the other for anxiety.  Updated nurse, she states she will update physician.                   Action/Plan:   Expected Discharge Date:                  Expected Discharge Plan:  Home w Hospice Care  In-House Referral:     Discharge planning Services  CM Consult  Post Acute Care Choice:  Hospice, Durable Medical Equipment Choice offered to:  Patient  DME Arranged:  Oxygen DME Agency:  [place]:  RN, Social Work [employer] Agency:  [place]  Status of Service:     [employer] Important Message Given:  Yes-fourth notification given Date Medicare IM Given:    Medicare IM give by:    Date Additional Medicare IM Given:    Additional Medicare Important Message give by:     If discussed at [place] of Stay Meetings, dates discussed:    Additional Comments:  Vergie Living, RN 11/08/2014, 1:44 PM

## 2014-11-08 NOTE — Care Management Important Message (Signed)
Important Message  Patient Details  Name: Tamara Key MRN: [id number] Date of Birth: 09-May-1940   Medicare Important Message Given:  Yes-fourth notification given    [name] 11/08/2014, 12:08 PM

## 2014-11-08 NOTE — Discharge Summary (Addendum)
Physician Discharge Summary  Tamara Key ALP:[id number] DOB: 1940-08-09 DOA: 10/31/2014  PCP: No PCP Per Patient  Admit date: 10/31/2014 Discharge date: 11/08/2014  Time spent: Greater than 30 minutes  Recommendations for Outpatient Follow-up:  1. Home hospice  Discharge Diagnoses:  Active Problems:   Pneumonia   Hyponatremia   COPD (chronic obstructive pulmonary disease) (HCC)   HTN (hypertension)   Hypoxia   Lung collapse   Acute respiratory failure with hypoxia ([place])   Palliative care encounter   DNR (do not resuscitate) discussion   Discharge Condition: Improved & Stable  Diet recommendation: Regular diet  Filed Weights   10/31/14 2000 10/31/14 2130  Weight: 48.535 kg (107 lb) 48.1 kg (106 lb 0.7 oz)    History of present illness:  74 y/o F, current smoker, with a PMH of COPD and lung cancer s/p resection and chemotherapy x3 in 2009 (Dr. Arlyce Dice, followed by Dr. Hinton Rao in [place]) who presented to [facility] on 10/12 with L sided chest pain & SOB for one week. Work up concerning for left subcarinal mass with post-obstructive PNA. PCCM consulted - discussed with patient & family, decision was made to make patient a full DNR, no bronchoscopy, qualify for home O2 and have palliative care see patient to arrange for home hospice for symptom control.  Hospital Course:   Large subcarinal mass with left lung collapse - New 6 x 3 x 3 cm mass on CT. Concerning for malignant recurrence versus mucus plugging. - Initial chest x-ray 10/11 at [facility] was not far off from baseline film post lobectomy. However x-ray 10/12 showed significant change with left-sided opacity and volume loss raising concern for acute process like mucous plugging +/- postobstructive process - Hx of Non-Small Cell Lung Cancer s/p Resection (Stage II-A in 2009) - s/p LUL lobectomy & three rounds of chemotherapy. - Patient and family elected no bronch and no treatment regarding mass, patient does not wish  for any form of treatment even if cancer. Cancelled bronch. - Palliative care evaluated patient. Discussed with palliative care team yesterday and today. Home hospice has been arranged.  Acute respiratory failure with hypoxia - Secondary to lung mass concerning for cancer, left lung collapse, postobstructive pneumonia and COPD - Off BiPAP - Saturating in the mid 90s on 6 L/m with nasal cannula. Appears more comfortable than she did yesterday. Morphine ordered for dyspnea which patient is reluctant to use but states that it has helped her last night.  COPD - stable. No clinical bronchospasm - Continue nebs and oxygen for comfort.  Post-Obstructive PNA - Completed antibiotics 10/19.  - Completed Mucomyst.  Tobacco abuse - Ongoing smoking up to admission. Cessation counseled.  Dementia versus delirium - Not clear of her baseline mental status but seemed appropriate today.  Weight loss/adult failure to thrive\  Acute kidney injury - Resolved  Anemia, possibly of chronic disease - Stable  Hyponatremia -Secondary to dehydration versus SIADH. Resolved   Protein calorie malnutrition - Nutritional supplements.   Discussed with patient's daughter Ms. Carrie Lapine- updated care answered questions.   Consultants:  PCCM  Palliative team  Procedures:  BIPAP-DC'ed  Antibiotics:  Azithromycin 10/12 > 10/17  IV Rocephin 10/12 > 10/19  Levofloxacin 1 dose on 10/12  Zosyn 10/12 > 10/13   Discharge Exam:  Complaints:  Tearful at times. States that she does not want to take morphine because she lost a son to drug abuse but explained to her that her situation is different and it is appropriate  to use-she verbalized understanding. Dyspnea improved after morphine last night.  Filed Vitals:   11/08/14 0700 11/08/14 0815 11/08/14 0954 11/08/14 1200  BP: 115/83  123/36   Pulse: 93     Temp: 97.9 F (36.6 C)   97.4 F (36.3 C)  TempSrc: Oral   Oral  Resp: 20      Height:      Weight:      SpO2: 94% 96%      General exam: Small built and frail pleasant elderly lady lying propped up in bed with mild increased work of breathing Respiratory system: reduced breath sounds bilaterally without wheezing or rhonchi. Mild increased work of breathing. Cardiovascular system: S1 & S2 heard, RRR. No JVD, murmurs, gallops, clicks or pedal edema. Tele: SR-ST 100's Gastrointestinal system: Abdomen is nondistended, soft and nontender. Normal bowel sounds heard. Central nervous system: Alert and oriented 2. No focal neurological deficits. Extremities: Symmetric 5 x 5 power.  Discharge Instructions      Discharge Instructions    Call MD for:  difficulty breathing, headache or visual disturbances    Complete by:  As directed      Call MD for:  extreme fatigue    Complete by:  As directed      Call MD for:  hives    Complete by:  As directed      Call MD for:  persistant dizziness or light-headedness    Complete by:  As directed      Call MD for:  persistant nausea and vomiting    Complete by:  As directed      Call MD for:  severe uncontrolled pain    Complete by:  As directed      Call MD for:  temperature >100.4    Complete by:  As directed      Diet general    Complete by:  As directed      Discharge instructions    Complete by:  As directed   Oxygen via nasal cannula at 6 L/m. Titrate to comfort.     Increase activity slowly    Complete by:  As directed             Medication List    STOP taking these medications        azithromycin 250 MG tablet  Commonly known as:  ZITHROMAX     COMBIVENT RESPIMAT 20-100 MCG/ACT Aers respimat  Generic drug:  Ipratropium-Albuterol  Replaced by:  ipratropium-albuterol 0.5-2.5 (3) MG/3ML Soln      TAKE these medications        albuterol (2.5 MG/3ML) 0.083% nebulizer solution  Commonly known as:  PROVENTIL  Take 3 mLs (2.5 mg total) by nebulization every 4 (four) hours as needed for wheezing or  shortness of breath.     ALPRAZolam 0.25 MG tablet  Commonly known as:  XANAX  Take 1 tablet (0.25 mg total) by mouth 2 (two) times daily as needed for anxiety or sleep.     amLODipine 5 MG tablet  Commonly known as:  NORVASC  Take 5 mg by mouth daily.     feeding supplement (ENSURE ENLIVE) Liqd  Take 237 mLs by mouth 3 (three) times daily between meals.     ipratropium-albuterol 0.5-2.5 (3) MG/3ML Soln  Commonly known as:  DUONEB  Take 3 mLs by nebulization 4 (four) times daily.     morphine CONCENTRATE 10 MG/0.5ML Soln concentrated solution  Take 0.13 mLs (2.6 mg total) by mouth  every 4 (four) hours as needed for moderate pain, severe pain or shortness of breath (increased work of breathing, RR 22 or greater.).       Follow-up Information    Follow up with Home Hospice.   Why:  Will continue to assist you       The results of significant diagnostics from this hospitalization (including imaging, microbiology, ancillary and laboratory) are listed below for reference.    Significant Diagnostic Studies: Ct Angio Chest Pe W/cm &/or Wo Cm  10/31/2014  CLINICAL DATA:  Chest pain and left upper quadrant abdominal pain. Shortness of breath. Low O2 saturation. Nausea. History of lung cancer with left lobectomy. EXAM: CT ANGIOGRAPHY CHEST WITH CONTRAST TECHNIQUE: Multidetector CT imaging of the chest was performed using the standard protocol during bolus administration of intravenous contrast. Multiplanar CT image reconstructions and MIPs were obtained to evaluate the vascular anatomy. CONTRAST:  38m OMNIPAQUE IOHEXOL 350 MG/ML SOLN COMPARISON:  Chest x-ray dated 10/31/2014, 10/30/2014 and 12/29/2012 and chest CT dated 01/04/2012 FINDINGS: There are no pulmonary emboli. There is a 6 x 3 x 3 cm mass just below the carina extending into the left hilum obstructing the bronchus to the remaining portion of the left lung. This mass also compresses the esophagus at the level of the carina. There  is almost complete collapse/consolidation of the remaining portion of the left lung. There is either tumor or fluid in the pericardium to the left of midline. Fairly extensive coronary artery disease. Heart size is normal. Severe emphysema of the right upper lobe. Visualized portion of the upper abdomen demonstrates no acute abnormality. Chronic biliary ductal dilatation. Chronic a parapelvic cyst on the left kidney. Extensive aortic atherosclerosis. Chronic accentuation of the thoracic kyphosis. Review of the MIP images confirms the above findings. 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 Pericardial tumor or fluid adjacent to the mass. Almost complete collapse/ consolidation of the remaining portion of the left lung. Severe emphysema in the right upper lobe. Electronically Signed   By: JLorriane ShireM.D.   On: 10/31/2014 18:20   Dg Chest Port 1 View  11/06/2014  CLINICAL DATA:  Shortness of breath EXAM: PORTABLE CHEST 1 VIEW COMPARISON:  Portable chest x-ray of 11/05/2014 FINDINGS: There is further loss of aeration of the portion of the left upper lung for the was aerated previously. There is complete opacification now present of the left lung with significant mediastinal shift to the left indicating significant volume loss. The right lung remains hyperaerated. Heart size is difficult to assess. IMPRESSION: There is now complete collapse of the left lung with mediastinal shift to the left indicating atelectasis. The right lung appears grossly clear. Electronically Signed   By: PIvar DrapeM.D.   On: 11/06/2014 08:05   Dg Chest Port 1 View  11/05/2014  CLINICAL DATA:  Subsequent encounter for shortness of breath EXAM: PORTABLE CHEST 1 VIEW COMPARISON:  11/04/2014 FINDINGS: 0713 hours. Continued further opacification of the left hemi thorax with apparent associated increase and left hemithoracic volume loss. Imaging features are suspicious for worsening collapse/  consolidation of the residual left lung. Right lung is hyperexpanded without focal airspace consolidation. Interstitial markings are diffusely coarsened with chronic features. Bones are diffusely demineralized. Telemetry leads overlie the chest. IMPRESSION: Worsening collapse/consolidation in the residual left lung. Electronically Signed   By: EMisty StanleyM.D.   On: 11/05/2014 07:49   Dg Chest Portable 1 View  11/04/2014  CLINICAL DATA:  pneumonia.  COPD.  Hypertension. EXAM: PORTABLE CHEST 1 VIEW COMPARISON:  One day prior FINDINGS: Tracheal deviation to the left. Moderate cardiomegaly. Surgical clips about the left hilum. Small to moderate left pleural effusion. No pneumothorax. The right lung remains clear. Pleural-parenchymal opacity in the inferior left hemi thorax is increased. IMPRESSION: Worsened left-sided aeration. Suspicious for infection, superimposed upon postoperative atelectasis. Increase in small to moderate left pleural effusion. Electronically Signed   By: Abigail Miyamoto M.D.   On: 11/04/2014 08:31   Dg Chest Port 1 View  11/03/2014  CLINICAL DATA:  Lung collapse.  COPD.  Asthma. EXAM: PORTABLE CHEST 1 VIEW COMPARISON:  11/02/2014 FINDINGS: Patient rotated left. Cardiomegaly accentuated by AP portable technique. Volume loss in the left hemi thorax with pleural-parenchymal opacity within the inferior left hemi thorax. No pneumothorax. Right costophrenic angle blunting is likely due to pleural thickening. Right-sided hyperinflation. Diffuse interstitial thickening. Surgical changes about the left hilum. Atherosclerosis in the transverse aorta. IMPRESSION: No significant change since one day prior. Volume loss with presumed postoperative pleural-parenchymal opacity in the inferior left hemi thorax. No pneumothorax or other acute complication. Electronically Signed   By: Abigail Miyamoto M.D.   On: 11/03/2014 09:25   Dg Chest Port 1 View  11/02/2014  CLINICAL DATA:  Hypoxia EXAM: PORTABLE CHEST  1 VIEW COMPARISON:  Study obtained earlier in the day FINDINGS: Postoperative changes again noted on the left with shift of heart and mediastinum toward the left, stable. There is volume loss the left base with probable small effusion. The right lung is hyperexpanded but clear. Heart size is upper normal with pulmonary vascularity within normal limits given the postoperative change on the left. No adenopathy. There is atherosclerotic calcification on the left. No bone lesions. IMPRESSION: No appreciable change compared to study obtained earlier in the day. Electronically Signed   By: Lowella Grip III M.D.   On: 11/02/2014 13:40   Dg Chest Port 1 View  11/02/2014  CLINICAL DATA:  Mucous plugging. EXAM: PORTABLE CHEST 1 VIEW COMPARISON:  10/31/2014. FINDINGS: Mediastinum is normal. Postsurgical changes left lung. Atelectatic changes left lung base again noted With left pleural effusion again noted. No pneumothorax. Cardiomegaly. No pulmonary venous congestion. No pneumothorax. No acute bony abnormality . IMPRESSION: 1. Postsurgical changes left lung. 2. Persistent atelectatic changes left lung base with persistent left pleural effusion. Electronically Signed   By: Marcello Moores  Register   On: 11/02/2014 07:33   Dg Chest Portable 1 View  10/31/2014  CLINICAL DATA:  Left upper abdominal pain, chest pain for 1 week. Worsening today. Shortness of breath. Low O2 sats. History of lung cancer EXAM: PORTABLE CHEST 1 VIEW COMPARISON:  10/30/2014 at [place]: Postoperative changes on the left. Worsening aeration on the left with decreasing lung volumes and increasing airspace opacity, presumably atelectasis with the worsening and volume loss. Right lung is clear. Heart is normal size. Underlying COPD. No visible effusions or acute bony abnormality. IMPRESSION: Worsening aeration on the left with increasing airspace disease and worsening volume loss, presumably related to atelectasis. COPD.  Electronically Signed   By: Rolm Baptise M.D.   On: 10/31/2014 16:58    Microbiology: Recent Results (from the past 240 hour(s))  Urine culture     Status: None   Collection Time: 10/31/14  5:44 PM  Result Value Ref Range Status   Specimen Description URINE, CATHETERIZED  Final   Special Requests NONE  Final   Culture NO GROWTH 1 DAY  Final   Report Status 11/01/2014 FINAL  Final  MRSA PCR Screening     Status: None   Collection Time: 10/31/14 10:20 PM  Result Value Ref Range Status   MRSA by PCR NEGATIVE NEGATIVE Final    Comment:        The GeneXpert MRSA Assay (FDA approved for NASAL specimens only), is one component of a comprehensive MRSA colonization surveillance program. It is not intended to diagnose MRSA infection nor to guide or monitor treatment for MRSA infections.   Culture, blood (routine x 2) Call MD if unable to obtain prior to antibiotics being given     Status: None   Collection Time: 10/31/14 10:50 PM  Result Value Ref Range Status   Specimen Description BLOOD RIGHT HAND  Final   Special Requests IN PEDIATRIC BOTTLE 1CC  Final   Culture NO GROWTH 5 DAYS  Final   Report Status 11/05/2014 FINAL  Final  Culture, blood (routine x 2) Call MD if unable to obtain prior to antibiotics being given     Status: None   Collection Time: 10/31/14 11:05 PM  Result Value Ref Range Status   Specimen Description BLOOD RIGHT ARM  Final   Special Requests BOTTLES DRAWN AEROBIC ONLY 5CC  Final   Culture NO GROWTH 5 DAYS  Final   Report Status 11/05/2014 FINAL  Final     Labs: Basic Metabolic Panel:  Recent Labs Lab 11/02/14 0243 11/03/14 0850 11/05/14 0250 11/06/14 0230 11/07/14 0500  NA 130* 132* 132* 136  --   K 3.8 3.8 4.4 4.7  --   CL 90* 100* 96* 98*  --   CO2 '27 25 27 28  '$ --   GLUCOSE 82 164* 87 118*  --   BUN 39* 30* 24* 22*  --   CREATININE 1.24* 0.67 0.50 0.61 0.62  CALCIUM 9.5 8.7* 8.4* 8.9  --    Liver Function Tests: No results for input(s):  AST, ALT, ALKPHOS, BILITOT, PROT, ALBUMIN in the last 168 hours. No results for input(s): LIPASE, AMYLASE in the last 168 hours. No results for input(s): AMMONIA in the last 168 hours. CBC:  Recent Labs Lab 11/02/14 0243 11/03/14 0850 11/05/14 0250 11/06/14 0230  WBC 16.4* 15.1* 8.7 8.4  HGB 12.4 12.4 11.7* 11.3*  HCT 36.2 36.7 35.1* 34.0*  MCV 88.9 89.3 90.2 89.7  PLT 359 364 323 343   Cardiac Enzymes: No results for input(s): CKTOTAL, CKMB, CKMBINDEX, TROPONINI in the last 168 hours. BNP: BNP (last 3 results)  Recent Labs  10/31/14 1630  BNP 106.3*    ProBNP (last 3 results) No results for input(s): PROBNP in the last 8760 hours.  CBG: No results for input(s): GLUCAP in the last 168 hours.      Signed:  Vernell Leep, MD, FACP, FHM. [facility] Pager [phone]  If 7PM-7AM, please contact night-coverage [email or web address] Password [email or web address] 11/08/2014, 12:25 PM

## 2014-11-08 NOTE — Discharge Instructions (Signed)
Lung Cancer °Lung cancer occurs when abnormal cells in the lung grow out of control and form a mass (tumor). There are several types of lung cancer. The two most common types are: °· Non-small cell. In this type of lung cancer, abnormal cells are larger and grow more slowly than those of small cell lung cancer. °· Small cell. In this type of lung cancer, abnormal cells are smaller than those of non-small cell lung cancer. Small cell lung cancer gets worse faster than non-small cell lung cancer. °CAUSES  °The leading cause of lung cancer is smoking tobacco. The second leading cause is radon exposure. °RISK FACTORS °· Smoking tobacco. °· Exposure to secondhand tobacco smoke. °· Exposure to radon gas. °· Exposure to asbestos. °· Exposure to arsenic in drinking water. °· Air pollution. °· Family or personal history of lung cancer. °· Lung radiation therapy. °· Being older than 65 years. °SIGNS AND SYMPTOMS  °In the early stages, symptoms may not be present. As the cancer progresses, symptoms may include: °· A lasting cough, possibly with blood. °· Fatigue. °· Unexplained weight loss. °· Shortness of breath. °· Wheezing. °· Chest pain. °· Loss of appetite. °Symptoms of advanced lung cancer include: °· Hoarseness. °· Bone or joint pain. °· Weakness. °· Nail problems. °· Face or arm swelling. °· Paralysis of the face. °· Drooping eyelids. °DIAGNOSIS  °Lung cancer can be identified with a physical exam and with tests such as: °· A chest X-ray. °· A CT scan. °· Blood tests. °· A biopsy. °After a diagnosis is made, you will have more tests to determine the stage of the cancer. The stages of non-small cell lung cancer are: °· Stage 0, also called carcinoma in situ. At this stage, abnormal cells are found in the inner lining of your lung or lungs. °· Stage I. At this stage, abnormal cells have grown into a tumor that is no larger than 5 cm across. The cancer has entered the deeper lung tissue but has not yet entered the lymph  nodes or other parts of the body. °· Stage II. At this stage, the tumor is 7 cm across or smaller and has entered nearby lymph nodes. Or, the tumor is 5 cm across or smaller and has invaded surrounding tissue but is not found in nearby lymph nodes. There may be more than one tumor present. °· Stage III. At this stage, the tumor may be any size. There may be more than one tumor in the lungs. The cancer cells have spread to the lymph nodes and possibly to other organs. °· Stage IV. At this stage, there are tumors in both lungs and the cancer has spread to other areas of the body. °The stages of small cell lung cancer are: °· Limited. At this stage, the cancer is found only on one side of the chest. °· Extensive. At this stage, the cancer is in the lungs and in tissues on the other side of the chest. The cancer has spread to other organs or is found in the fluid between the layers of your lungs. °TREATMENT  °Depending on the type and stage of your lung cancer, you may be treated with: °· Surgery. This is done to remove a tumor. °· Radiation therapy. This treatment destroys cancer cells using X-rays or other types of radiation. °· Chemotherapy. This treatment uses medicines to destroy cancer cells. °· Targeted therapy. This treatment aims to destroy only cancer cells instead of all cells as other therapies do. °You may   also have a combination of treatments. °HOME CARE INSTRUCTIONS  °· Do not use any tobacco products. This includes cigarettes, chewing tobacco, and electronic cigarettes. If you need help quitting, ask your health care provider. °· Take medicines only as directed by your health care provider. °· Eat a healthy diet. Work with a dietitian to make sure you are getting the nutrition you need. °· Consider joining a support group or seeking counseling to help you cope with the stress of having lung cancer. °· Let your cancer specialist (oncologist) know if you are admitted to the hospital. °· Keep all follow-up  visits as directed by your health care provider. This is important. °SEEK MEDICAL CARE IF:  °· You lose weight without trying. °· You have a persistent cough and wheezing. °· You feel short of breath. °· You tire easily. °· You experience bone or joint pain. °· You have difficulty swallowing. °· You feel hoarse or notice your voice changing. °· Your pain medicine is not helping. °SEEK IMMEDIATE MEDICAL CARE IF:  °· You cough up blood. °· You have new breathing problems. °· You develop chest pain. °· You develop swelling in: °¨ One or both ankles or legs. °¨ Your face, neck, or arms. °· You are confused. °· You experience paralysis in your face or a drooping eyelid. °  °This information is not intended to replace advice given to you by your health care provider. Make sure you discuss any questions you have with your health care provider. °  °Document Released: 04/13/2000 Document Revised: 09/26/2014 Document Reviewed: 05/11/2013 °Elsevier Interactive Patient Education ©2016 [employer] ° °

## 2014-11-08 NOTE — Consult Note (Signed)
[facility] The patient has been approved for hospice services in the home following discharge. DME ordered for delivery by noon today: Fully electric bed with T5 mattress and 1/2 rails, OBT, nebulizer, WC, 3 in 1, O2 concentrator, tanks. The patient owns a rollator walker. Daughter, Rayfield Citizen, confirmed that initially, the patient will go to her apartment in [place]. Daughter, Morey Hummingbird, will be with the patient as her caregiver. The plan is for the patient to be relocated in November to [facility] home in [place]. Carrie plans to transport the patient via car with portable O2 tank, if the patient is able. Awaiting Discharge Summary and order.  Yetta Glassman RN, [facility] [place]

## 2014-12-20 DEATH — deceased
